# Patient Record
Sex: Female | Born: 1975 | Race: White | Hispanic: No | Marital: Married | Smoking: Never smoker
Health system: Southern US, Community
[De-identification: ages and names within clinical notes are randomized; demographics above are authoritative.]

## PROBLEM LIST (undated history)

## (undated) DIAGNOSIS — I10 Essential (primary) hypertension: Secondary | ICD-10-CM

## (undated) DIAGNOSIS — K219 Gastro-esophageal reflux disease without esophagitis: Secondary | ICD-10-CM

## (undated) DIAGNOSIS — T7840XA Allergy, unspecified, initial encounter: Secondary | ICD-10-CM

## (undated) HISTORY — DX: Allergy, unspecified, initial encounter: T78.40XA

## (undated) HISTORY — PX: JOINT REPLACEMENT: SHX530

## (undated) HISTORY — PX: TUBAL LIGATION: SHX77

## (undated) HISTORY — PX: EYE SURGERY: SHX253

## (undated) HISTORY — PX: ADENOIDECTOMY: SUR15

---

## 2011-06-23 ENCOUNTER — Emergency Department (HOSPITAL_BASED_OUTPATIENT_CLINIC_OR_DEPARTMENT_OTHER): Payer: PRIVATE HEALTH INSURANCE

## 2011-06-23 ENCOUNTER — Emergency Department (HOSPITAL_BASED_OUTPATIENT_CLINIC_OR_DEPARTMENT_OTHER)
Admission: EM | Admit: 2011-06-23 | Discharge: 2011-06-23 | Disposition: A | Discharge status: Home / Self Care | Payer: PRIVATE HEALTH INSURANCE | Attending: Emergency Medicine | Admitting: Emergency Medicine

## 2011-06-23 ENCOUNTER — Emergency Department (INDEPENDENT_AMBULATORY_CARE_PROVIDER_SITE_OTHER): Payer: PRIVATE HEALTH INSURANCE

## 2011-06-23 ENCOUNTER — Encounter: Payer: Self-pay | Admitting: *Deleted

## 2011-06-23 DIAGNOSIS — W010XXA Fall on same level from slipping, tripping and stumbling without subsequent striking against object, initial encounter: Secondary | ICD-10-CM | POA: Insufficient documentation

## 2011-06-23 DIAGNOSIS — S4980XA Other specified injuries of shoulder and upper arm, unspecified arm, initial encounter: Secondary | ICD-10-CM

## 2011-06-23 DIAGNOSIS — M25519 Pain in unspecified shoulder: Secondary | ICD-10-CM | POA: Insufficient documentation

## 2011-06-23 DIAGNOSIS — S46909A Unspecified injury of unspecified muscle, fascia and tendon at shoulder and upper arm level, unspecified arm, initial encounter: Secondary | ICD-10-CM | POA: Insufficient documentation

## 2011-06-23 DIAGNOSIS — Y92009 Unspecified place in unspecified non-institutional (private) residence as the place of occurrence of the external cause: Secondary | ICD-10-CM | POA: Insufficient documentation

## 2011-06-23 HISTORY — DX: Gastro-esophageal reflux disease without esophagitis: K21.9

## 2011-06-23 HISTORY — DX: Essential (primary) hypertension: I10

## 2011-06-23 MED ORDER — HYDROCODONE-ACETAMINOPHEN 5-325 MG PO TABS: 2.0000 | ORAL_TABLET | ORAL | Status: AC | PRN

## 2011-06-23 MED ORDER — IBUPROFEN 800 MG PO TABS: 800.0000 mg | ORAL_TABLET | Freq: Once | ORAL | Status: DC

## 2011-06-23 NOTE — ED Notes (Signed)
Pt given Tylenol 1gm and Motrin 800mg   Immediately following accident while en route to xray.  Meds taken from floor stock

## 2011-06-23 NOTE — ED Provider Notes (Signed)
History     CSN: [id number] Arrival date & time: 06/23/2011  9:41 PM  No chief complaint on file.  Patient is a 35 y.o. female presenting with shoulder injury. The history is provided by the patient.  Shoulder Injury This is a new problem. The current episode started today. The problem occurs constantly. The problem has been unchanged. Associated symptoms include joint swelling. The symptoms are aggravated by exertion. She has tried ice for the symptoms. The treatment provided mild relief.  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  No loss of conciousness,  No impact of head.    No past medical history on file.  No past surgical history on file.  No family history on file.  History  Substance Use Topics  . Smoking status: Not on file  . Smokeless tobacco: Not on file  . Alcohol Use: Not on file    OB History    No data available      Review of Systems  Musculoskeletal: Positive for joint swelling.  All other systems reviewed and are negative.    Physical Exam  There were no vitals taken for this visit.  Physical Exam  Nursing note and vitals reviewed. Constitutional: She appears well-developed and well-nourished.  HENT:  Head: Normocephalic and atraumatic.  Neck: Normal range of motion. Neck supple.  Musculoskeletal: She exhibits tenderness.  Neurological: She is alert.  Skin: Skin is warm and dry.  Psychiatric: She has a normal mood and affect.    ED Course  Procedures  MDM Xray no obv fx,  Small dark area center clavicle,  I discussed with Radiology, he advised does not appear to be a fracture.   Pt placed in a shoulder immbolizer and  Referred to [facility] for followup.      Langston Masker, [place] 06/24/11 [phone]

## 2011-06-23 NOTE — ED Notes (Signed)
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  Pt reports "burning sensation"  Pt taken to xray

## 2011-06-24 ENCOUNTER — Encounter: Payer: Self-pay | Admitting: Family Medicine

## 2011-06-24 ENCOUNTER — Ambulatory Visit (INDEPENDENT_AMBULATORY_CARE_PROVIDER_SITE_OTHER): Payer: Worker's Compensation | Admitting: Family Medicine

## 2011-06-24 VITALS — BP 146/97 | HR 92 | Temp 98.3°F | Ht 64.0 in | Wt 185.0 lb

## 2011-06-24 DIAGNOSIS — S4980XA Other specified injuries of shoulder and upper arm, unspecified arm, initial encounter: Secondary | ICD-10-CM

## 2011-06-24 DIAGNOSIS — S46909A Unspecified injury of unspecified muscle, fascia and tendon at shoulder and upper arm level, unspecified arm, initial encounter: Secondary | ICD-10-CM

## 2011-06-24 DIAGNOSIS — S4992XA Unspecified injury of left shoulder and upper arm, initial encounter: Secondary | ICD-10-CM

## 2011-06-24 NOTE — Progress Notes (Signed)
Subjective:    Patient ID: Tara [name], female    DOB: [birth year], 35 y.o.   MRN: [id number]  PCP: Dr. Cephus Richer  HPI 35 yo F here for left shoulder injury.  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 She backed up and tripped over a cord for a [employer] hugger and fell down directly onto left shoulder. Immediate pain especially anterior left shoulder. Has improved some since then but still has burning pain anterior shoulder. Has been icing, took aleve this am, and using sling for support. No prior left shoulder injuries. She is right handed. No neck pain or radiation into left arm.  Past Medical History  Diagnosis Date  . Hypertension   . GERD (gastroesophageal reflux disease)   . Allergy     Current Outpatient Prescriptions on File Prior to Visit  Medication Sig Dispense Refill  . drospirenone-ethinyl estradiol (YAZ,GIANVI,LORYNA) 3-0.02 MG tablet Take 1 tablet by mouth daily.        . fluticasone (FLONASE) 50 MCG/ACT nasal spray Place 2 sprays into the nose daily.        Marland Kitchen HYDROcodone-acetaminophen (NORCO) 5-325 MG per tablet Take 2 tablets by mouth every 4 (four) hours as needed for pain.  20 tablet  0  . loratadine (CLARITIN) 10 MG tablet Take 10 mg by mouth daily.        Marland Kitchen losartan (COZAAR) 50 MG tablet Take 50 mg by mouth daily.        . ranitidine (ZANTAC) 150 MG capsule Take 150 mg by mouth 2 (two) times daily.         Current Facility-Administered Medications on File Prior to Visit  Medication Dose Route Frequency Provider Last Rate Last Dose  . DISCONTD: ibuprofen (ADVIL,MOTRIN) tablet 800 mg  800 mg Oral Once Langston Masker, [place]        Past Surgical History  Procedure Date  . Tubal ligation   . Joint replacement   . Adenoidectomy     Allergies  Allergen Reactions  . Theophyllines     History   Social History  . Marital Status: Married    Spouse Name: N/A    Number of Children: N/A  . Years of  Education: N/A   Occupational History  . Not on file.   Social History Main Topics  . Smoking status: Never Smoker   . Smokeless tobacco: Not on file  . Alcohol Use: No  . Drug Use: No  . Sexually Active: Yes    Birth Control/ Protection: Pill, Surgical   Other Topics Concern  . Not on file   Social History Narrative  . No narrative on file    Family History  Problem Relation Age of Onset  . Sudden death Neg Hx   . Hypertension Neg Hx   . Hyperlipidemia Neg Hx   . Diabetes Neg Hx   . Heart attack Neg Hx     BP 146/97  Pulse 92  Temp(Src) 98.3 F (36.8 C) (Oral)  Ht 5\' 4"  (1.626 m)  Wt 185 lb (83.915 kg)  BMI 31.76 kg/m2  LMP 05/25/2011  Review of Systems See HPI above.    Objective:   Physical Exam Gen: NAD L shoulder: No swelling, ecchymoses.  No gross deformity. TTP lateral 1/2 of clavicle, superior pectoralis muscle and biceps tendon.  No TTP at [facility] joint however. FROM but pain anterior shoulder with all motions. Negative Hawkins, Neers. Negative Speeds, Yergasons. Negative Empty  can and resisted internal/external rotation with 5/5 strength. Negative apprehension. NV intact distally.  R shoulder: FROM without pain or weakness.    Assessment & Plan:  1. Left shoulder injury - X-rays reviewed and read as normal.  There is a very small area of middle superior clavicle that may represent part of a nondisplaced fracture.  However, would expect her to have more swelling and bruising following a fracture in this area.  More likely she has bony contusion and biceps/pec strains.  Treat conservatively - icing, aleve, sling for comfort.  Does not have to work until Wednesday - will monitor progress over next several days.  If still having severe pain with motions by Wednesday, advised her she should stay out of work and I would like to see her for follow-up 7-10 days from now for repeat evaluation and x-rays.  If she continues to improve, may return to work without  restrictions and follow-up as needed.

## 2011-06-24 NOTE — Assessment & Plan Note (Signed)
X-rays reviewed and read as normal.  There is a very small area of middle superior clavicle that may represent part of a nondisplaced fracture.  However, would expect her to have more swelling and bruising following a fracture in this area.  More likely she has bony contusion and biceps/pec strains.  Treat conservatively - icing, aleve, sling for comfort.  Does not have to work until Wednesday - will monitor progress over next several days.  If still having severe pain with motions by Wednesday, advised her she should stay out of work and I would like to see her for follow-up 7-10 days from now for repeat evaluation and x-rays.  If she continues to improve, may return to work without restrictions and follow-up as needed.

## 2011-06-24 NOTE — Patient Instructions (Signed)
Your exam is very reassuring. Your rotator cuff is intact, you do not have evidence of a shoulder dislocation or separation. You do have a distal clavicle contusion and strained your lateral pec muscle - the burning is likely from microtears that you get in the muscle though you could have injured some sensory nerves. This should heal well with relative rest, icing, aleve (2 tabs twice a day with food). Activity as tolerated. Use sling as needed.

## 2011-07-11 NOTE — ED Provider Notes (Signed)
Evaluation and management procedures were performed by the PA/NP under my supervision/collaboration.    Felisa Bonier, MD 07/11/11 [phone]

## 2014-08-07 ENCOUNTER — Encounter: Payer: Self-pay | Admitting: Emergency Medicine

## 2014-08-07 ENCOUNTER — Emergency Department
Admission: EM | Admit: 2014-08-07 | Discharge: 2014-08-07 | Disposition: A | Discharge status: Home / Self Care | Payer: 59 | Source: Home / Self Care | Attending: Emergency Medicine | Admitting: Emergency Medicine

## 2014-08-07 DIAGNOSIS — J209 Acute bronchitis, unspecified: Secondary | ICD-10-CM

## 2014-08-07 DIAGNOSIS — J042 Acute laryngotracheitis: Secondary | ICD-10-CM

## 2014-08-07 MED ORDER — AZITHROMYCIN 250 MG PO TABS: ORAL_TABLET | ORAL | Status: DC

## 2014-08-07 MED ORDER — PREDNISONE (PAK) 10 MG PO TABS: ORAL_TABLET | ORAL | Status: DC

## 2014-08-07 MED ORDER — PROMETHAZINE-CODEINE 6.25-10 MG/5ML PO SYRP: ORAL_SOLUTION | ORAL | Status: DC

## 2014-08-07 NOTE — ED Notes (Signed)
Tuesday started with sore throat, body aches, hoarseness, headache, productive cough, throat no longer sore.

## 2014-08-07 NOTE — ED Provider Notes (Signed)
CSN: [id number]     Arrival date & time 08/07/14  1253 History   First MD Initiated Contact with Patient 08/07/14 1300     Chief Complaint  Patient presents with  . URI   (Consider location/radiation/quality/duration/timing/severity/associated sxs/prior Treatment) HPI URI HISTORY  Louvinia is a 38 y.o. female who complains of onset of cold symptoms for 3 days ago, and now progressively worse. Started with a sore throat but that improved. Still having worsening hoarseness, sinus congestion, cough productive of yellowish sputum. Has chronic sinus allergies controlled on Flonase and oral antihistamines. She works as a Marine scientist at [employer]. No history of asthma. No chills/sweats +  Fever. No fever over 100.5 +  Nasal congestion +  Discolored Post-nasal drainage No sinus pain/pressure No sore throat now  +  cough No wheezing Positive chest congestion No hemoptysis No shortness of breath No pleuritic pain  No itchy/red eyes No earache  No nausea No vomiting No abdominal pain No diarrhea  No skin rashes +  Fatigue Mild myalgias No headache   Past Medical History  Diagnosis Date  . Hypertension   . GERD (gastroesophageal reflux disease)   . Allergy    Past Surgical History  Procedure Laterality Date  . Tubal ligation    . Joint replacement    . Adenoidectomy    . Eye surgery     Family History  Problem Relation Age of Onset  . Sudden death Neg Hx   . Hypertension Neg Hx   . Hyperlipidemia Neg Hx   . Diabetes Neg Hx   . Heart attack Neg Hx    History  Substance Use Topics  . Smoking status: Never Smoker   . Smokeless tobacco: Not on file  . Alcohol Use: No   OB History   Grav Para Term Preterm Abortions TAB SAB Ect Mult Living                 Review of Systems  All other systems reviewed and are negative.   Allergies  Theophyllines  Home Medications   Prior to Admission medications   Medication Sig Start Date End Date  Taking? Authorizing Provider  hydrochlorothiazide (HYDRODIURIL) 25 MG tablet Take 25 mg by mouth daily.   Yes Historical Provider, MD  levothyroxine (SYNTHROID, LEVOTHROID) 25 MCG tablet Take 25 mcg by mouth daily before breakfast.   Yes Historical Provider, MD  azithromycin (ZITHROMAX Z-PAK) 250 MG tablet Take 2 tablets on day one, then 1 tablet daily on days 2 through 5 08/07/14   Jacqulyn Cane, MD  drospirenone-ethinyl estradiol Sherrill Raring) 3-0.02 MG tablet Take 1 tablet by mouth daily.      Historical Provider, MD  fluticasone (FLONASE) 50 MCG/ACT nasal spray Place 2 sprays into the nose daily.      Historical Provider, MD  loratadine (CLARITIN) 10 MG tablet Take 10 mg by mouth daily.      Historical Provider, MD  losartan (COZAAR) 50 MG tablet Take 50 mg by mouth daily.      Historical Provider, MD  predniSONE (STERAPRED UNI-PAK) 10 MG tablet Take as directed for 6 days.--Take 6 on day 1, 5 on day 2, 4 on day 3, then 3 tablets on day 4, then 2 tablets on day 5, then 1 on day 6. 08/07/14   Jacqulyn Cane, MD  promethazine-codeine [facility] WITH CODEINE) 6.25-10 MG/5ML syrup Take 1-2 teaspoons every 4-6 hours as needed for cough. May cause drowsiness. 08/07/14   Jacqulyn Cane, MD  ranitidine (ZANTAC) 150 MG capsule Take 150 mg by mouth 2 (two) times daily.      Historical Provider, MD   BP 137/94  Pulse 115  Temp(Src) 98.2 F (36.8 C) (Oral)  Ht 5\' 4"  (1.626 m)  Wt 195 lb (88.451 kg)  BMI 33.46 kg/m2  SpO2 99% Physical Exam  Nursing note and vitals reviewed. Constitutional: She is oriented to person, place, and time. She appears well-developed and well-nourished. No distress.  HENT:  Head: Normocephalic and atraumatic.  Right Ear: Tympanic membrane normal.  Left Ear: Tympanic membrane normal.  Mouth/Throat: Oropharynx is clear and moist. No oropharyngeal exudate.   voice is hoarse. Pharynx minimal injection without tonsillar enlargement or exudate. Nose with boggy terminates  slight serous/mucoid drainage. TMs normal  Eyes: Conjunctivae are normal. Pupils are equal, round, and reactive to light. Right eye exhibits no discharge. Left eye exhibits no discharge. No scleral icterus.  Neck: Neck supple.  Cardiovascular: Normal rate, regular rhythm and normal heart sounds.   Pulmonary/Chest: No respiratory distress. She has no wheezes. She has rhonchi. She has no rales.  Abdominal: She exhibits no distension.  Musculoskeletal: She exhibits no edema and no tenderness.  Lymphadenopathy:    She has no cervical adenopathy.  Neurological: She is alert and oriented to person, place, and time. No cranial nerve deficit.  Skin: Skin is warm and dry. No rash noted.  Psychiatric: She has a normal mood and affect.    ED Course  Procedures (including critical care time) Labs Review Labs Reviewed - No data to display  Imaging Review No results found.   MDM   1. Acute laryngitis and tracheitis   2. Acute bronchitis, unspecified organism    In the differential is viral, bacterial, or atypical causes. Treatment options discussed, as well as risks, benefits, alternatives. Patient voiced understanding and agreement with the following plans:  Z-Pak prescribed-patient prefers to hold off on this for a day or 2 but fill rx if spikes a fever or not improving Other symptomatic care discussed. Prednisone 10 mg-6 day Dosepak for inflammatory component OTC symptomatic care and OTC cough med Small prescription for promethazine with codeine cough syrup for bedtime use only. Follow-up with your primary care doctor in 5-7 days if not improving, or sooner if symptoms become worse. Precautions discussed. Red flags discussed. Questions invited and answered. Patient voiced understanding and agreement.   Jacqulyn Cane, MD 08/07/14 1351

## 2014-11-17 ENCOUNTER — Encounter: Payer: Self-pay | Admitting: Family Medicine

## 2015-07-10 ENCOUNTER — Other Ambulatory Visit (HOSPITAL_BASED_OUTPATIENT_CLINIC_OR_DEPARTMENT_OTHER): Payer: Self-pay | Admitting: Obstetrics and Gynecology

## 2015-07-10 DIAGNOSIS — Z1231 Encounter for screening mammogram for malignant neoplasm of breast: Secondary | ICD-10-CM

## 2015-07-13 ENCOUNTER — Ambulatory Visit (HOSPITAL_BASED_OUTPATIENT_CLINIC_OR_DEPARTMENT_OTHER)
Admission: RE | Admit: 2015-07-13 | Discharge: 2015-07-13 | Disposition: A | Discharge status: Home / Self Care | Payer: 59 | Source: Ambulatory Visit | Attending: Obstetrics and Gynecology | Admitting: Obstetrics and Gynecology

## 2015-07-13 ENCOUNTER — Inpatient Hospital Stay (HOSPITAL_BASED_OUTPATIENT_CLINIC_OR_DEPARTMENT_OTHER): Admission: RE | Admit: 2015-07-13 | Payer: Self-pay | Source: Ambulatory Visit

## 2015-07-13 DIAGNOSIS — Z1231 Encounter for screening mammogram for malignant neoplasm of breast: Secondary | ICD-10-CM | POA: Insufficient documentation

## 2016-02-04 DIAGNOSIS — R6882 Decreased libido: Secondary | ICD-10-CM | POA: Diagnosis not present

## 2016-02-10 MED FILL — PANTOPRAZOLE SOD DR 40 MG T: 40 | 90 days supply | Qty: 90 | Fill #1

## 2016-02-10 MED FILL — FLUTICASONE PROP 50 MCG SPR: 50 | 90 days supply | Qty: 48 | Fill #2

## 2016-02-10 MED FILL — LOSARTAN POTASSIUM 100 MG T: 100 | 90 days supply | Qty: 90 | Fill #2

## 2016-02-10 MED FILL — HYDROXYZINE PAM 25 MG CAP: 25 | 90 days supply | Qty: 90 | Fill #1

## 2016-02-18 DIAGNOSIS — F39 Unspecified mood [affective] disorder: Secondary | ICD-10-CM | POA: Diagnosis not present

## 2016-04-04 MED FILL — AZITHROMYCIN 250 MG TABLET: 250 | 5 days supply | Qty: 6 | Fill #0

## 2016-05-16 MED FILL — FLUTICASONE PROP 50 MCG SPR: 50 | 90 days supply | Qty: 48 | Fill #3

## 2016-05-16 MED FILL — LOSARTAN POTASSIUM 100 MG T: 100 | 90 days supply | Qty: 90 | Fill #3

## 2016-05-16 MED FILL — PANTOPRAZOLE SOD DR 40 MG T: 40 | 90 days supply | Qty: 90 | Fill #2

## 2016-05-25 MED FILL — HYDROXYZINE PAM 25 MG CAP: 25 | 30 days supply | Qty: 30 | Fill #0

## 2016-06-28 ENCOUNTER — Other Ambulatory Visit (HOSPITAL_BASED_OUTPATIENT_CLINIC_OR_DEPARTMENT_OTHER): Payer: Self-pay | Admitting: Obstetrics and Gynecology

## 2016-06-28 DIAGNOSIS — Z139 Encounter for screening, unspecified: Secondary | ICD-10-CM

## 2016-07-13 DIAGNOSIS — Z01419 Encounter for gynecological examination (general) (routine) without abnormal findings: Secondary | ICD-10-CM | POA: Diagnosis not present

## 2016-07-13 DIAGNOSIS — R6882 Decreased libido: Secondary | ICD-10-CM | POA: Diagnosis not present

## 2016-07-25 ENCOUNTER — Ambulatory Visit (HOSPITAL_BASED_OUTPATIENT_CLINIC_OR_DEPARTMENT_OTHER)
Admission: RE | Admit: 2016-07-25 | Discharge: 2016-07-25 | Disposition: A | Discharge status: Home / Self Care | Payer: 59 | Source: Ambulatory Visit | Attending: Obstetrics and Gynecology | Admitting: Obstetrics and Gynecology

## 2016-07-25 ENCOUNTER — Other Ambulatory Visit (HOSPITAL_BASED_OUTPATIENT_CLINIC_OR_DEPARTMENT_OTHER): Payer: Self-pay | Admitting: Obstetrics and Gynecology

## 2016-07-25 DIAGNOSIS — Z1239 Encounter for other screening for malignant neoplasm of breast: Secondary | ICD-10-CM

## 2016-07-25 DIAGNOSIS — Z1231 Encounter for screening mammogram for malignant neoplasm of breast: Secondary | ICD-10-CM | POA: Insufficient documentation

## 2016-08-01 DIAGNOSIS — Z0000 Encounter for general adult medical examination without abnormal findings: Secondary | ICD-10-CM | POA: Diagnosis not present

## 2016-08-01 DIAGNOSIS — J301 Allergic rhinitis due to pollen: Secondary | ICD-10-CM | POA: Diagnosis not present

## 2016-08-01 DIAGNOSIS — K219 Gastro-esophageal reflux disease without esophagitis: Secondary | ICD-10-CM | POA: Diagnosis not present

## 2016-08-01 DIAGNOSIS — E784 Other hyperlipidemia: Secondary | ICD-10-CM | POA: Diagnosis not present

## 2016-08-01 DIAGNOSIS — Z800 Family history of malignant neoplasm of digestive organs: Secondary | ICD-10-CM | POA: Diagnosis not present

## 2016-08-01 DIAGNOSIS — I10 Essential (primary) hypertension: Secondary | ICD-10-CM | POA: Diagnosis not present

## 2016-08-01 MED FILL — FLUTICASONE PROP 50 MCG SPR: 50 | 90 days supply | Qty: 48 | Fill #0

## 2016-08-01 MED FILL — HYDROCHLOROTHIAZIDE 25 MG T: 25 | 90 days supply | Qty: 90 | Fill #0

## 2016-08-01 MED FILL — HYDROXYZINE PAM 25 MG CAP: 25 | 90 days supply | Qty: 90 | Fill #0

## 2016-08-01 MED FILL — PANTOPRAZOLE SOD DR 40 MG T: 40 | 90 days supply | Qty: 90 | Fill #0

## 2016-08-01 MED FILL — LOSARTAN POTASSIUM 100 MG T: 100 | 90 days supply | Qty: 90 | Fill #0

## 2016-08-18 DIAGNOSIS — E669 Obesity, unspecified: Secondary | ICD-10-CM | POA: Diagnosis not present

## 2016-08-18 DIAGNOSIS — K219 Gastro-esophageal reflux disease without esophagitis: Secondary | ICD-10-CM | POA: Diagnosis not present

## 2016-08-18 DIAGNOSIS — Z800 Family history of malignant neoplasm of digestive organs: Secondary | ICD-10-CM | POA: Diagnosis not present

## 2016-08-18 DIAGNOSIS — Z1211 Encounter for screening for malignant neoplasm of colon: Secondary | ICD-10-CM | POA: Diagnosis not present

## 2016-08-18 MED FILL — GAVILYTE-G SOLUTION: 236 | 1 days supply | Qty: 4000 | Fill #0

## 2016-09-01 DIAGNOSIS — Z0100 Encounter for examination of eyes and vision without abnormal findings: Secondary | ICD-10-CM | POA: Diagnosis not present

## 2016-09-01 DIAGNOSIS — H524 Presbyopia: Secondary | ICD-10-CM | POA: Diagnosis not present

## 2016-09-22 MED FILL — CIPROFLOXACIN HCL 500 MG TA: 500 | 7 days supply | Qty: 14 | Fill #0

## 2016-09-23 DIAGNOSIS — Z800 Family history of malignant neoplasm of digestive organs: Secondary | ICD-10-CM | POA: Diagnosis not present

## 2016-09-23 DIAGNOSIS — D125 Benign neoplasm of sigmoid colon: Secondary | ICD-10-CM | POA: Diagnosis not present

## 2016-09-23 DIAGNOSIS — K635 Polyp of colon: Secondary | ICD-10-CM | POA: Diagnosis not present

## 2016-09-23 DIAGNOSIS — Z1211 Encounter for screening for malignant neoplasm of colon: Secondary | ICD-10-CM | POA: Diagnosis not present

## 2016-09-30 ENCOUNTER — Emergency Department
Admission: EM | Admit: 2016-09-30 | Discharge: 2016-09-30 | Disposition: A | Discharge status: Home / Self Care | Payer: 59 | Source: Home / Self Care | Attending: Family Medicine | Admitting: Family Medicine

## 2016-09-30 ENCOUNTER — Encounter: Payer: Self-pay | Admitting: *Deleted

## 2016-09-30 DIAGNOSIS — B9789 Other viral agents as the cause of diseases classified elsewhere: Secondary | ICD-10-CM | POA: Diagnosis not present

## 2016-09-30 DIAGNOSIS — J069 Acute upper respiratory infection, unspecified: Secondary | ICD-10-CM

## 2016-09-30 MED ORDER — TRIAMCINOLONE ACETONIDE 40 MG/ML IJ SUSP
40.0000 mg | Freq: Once | INTRAMUSCULAR | Status: AC
  Administered 2016-09-30: 40 mg via INTRAMUSCULAR

## 2016-09-30 MED ORDER — BENZONATATE 200 MG PO CAPS: 200.0000 mg | ORAL_CAPSULE | Freq: Every day | ORAL | 0 refills | Status: DC

## 2016-09-30 MED ORDER — AZITHROMYCIN 250 MG PO TABS: ORAL_TABLET | ORAL | 0 refills | Status: DC

## 2016-09-30 MED FILL — AZITHROMYCIN 250 MG TABLET: 250 | 5 days supply | Qty: 6 | Fill #0

## 2016-09-30 NOTE — ED Triage Notes (Signed)
Patient c/o 5 days of productive cough and congestin. Sore throat initially. More recently SOB at times. Taken Mucinex, Delsym and tylenol.

## 2016-09-30 NOTE — ED Provider Notes (Signed)
Tara [name] C[facility]    CSN: [id number] Arrival date & time: 09/30/16  [id number]     History   Chief Complaint Chief Complaint  Patient presents with  . URI    HPI Tara [name] is a 40 y.o. female.   Patient complains of five day history of typical cold-like symptoms developing over several days, including mild sore throat, sinus congestion, headache, fatigue, and cough.  Begin two days ago she started feeling tight in her anterior chest with inspiration, but not pleuritic pain.  She has been taking Mucinex and Delsym. She has a history of perennial rhinitis and takes Claritin and Flonase.   The history is provided by the patient.    Past Medical History:  Diagnosis Date  . Allergy   . GERD (gastroesophageal reflux disease)   . Hypertension     Patient Active Problem List   Diagnosis Date Noted  . Injury of left shoulder 06/24/2011    Past Surgical History:  Procedure Laterality Date  . ADENOIDECTOMY    . EYE SURGERY    . JOINT REPLACEMENT    . TUBAL LIGATION      OB History    No data available       Home Medications    Prior to Admission medications   Medication Sig Start Date End Date Taking? Authorizing Provider  fluticasone (FLONASE) 50 MCG/ACT nasal spray Place 2 sprays into the nose daily.     Yes Historical Provider, MD  hydrochlorothiazide (HYDRODIURIL) 25 MG tablet Take 25 mg by mouth daily.   Yes Historical Provider, MD  loratadine (CLARITIN) 10 MG tablet Take 10 mg by mouth daily.   Yes Historical Provider, MD  losartan (COZAAR) 100 MG tablet Take 100 mg by mouth daily.   Yes Historical Provider, MD  pantoprazole (PROTONIX) 40 MG tablet Take 40 mg by mouth daily.   Yes Historical Provider, MD  Testosterone 20 % CREA by Does not apply route.   Yes Historical Provider, MD  azithromycin (ZITHROMAX Z-PAK) 250 MG tablet Take 2 tabs today; then begin one tab once daily for 4 more days. (Rx void after 10/08/16) 09/30/16   Kandra Nicolas, MD    benzonatate (TESSALON) 200 MG capsule Take 1 capsule (200 mg total) by mouth at bedtime. Take as needed for cough 09/30/16   Kandra Nicolas, MD    Family History Family History  Problem Relation Age of Onset  . Cancer Mother     Colon CA  . Sudden death Neg Hx   . Hypertension Neg Hx   . Hyperlipidemia Neg Hx   . Diabetes Neg Hx   . Heart attack Neg Hx     Social History Social History  Substance Use Topics  . Smoking status: Never Smoker  . Smokeless tobacco: Never Used  . Alcohol use No     Allergies   Theophyllines   Review of Systems Review of Systems + sore throat + cough No pleuritic pain No wheezing + nasal congestion + post-nasal drainage No sinus pain/pressure No itchy/red eyes No earache No hemoptysis + SOB No fever, + chills No nausea No vomiting No abdominal pain No diarrhea No urinary symptoms No skin rash + fatigue + myalgias No headache Used OTC meds without relief   Physical Exam Triage Vital Signs ED Triage Vitals  Enc Vitals Group     BP      Pulse      Resp      Temp  Temp src      SpO2      Weight      Height      Head Circumference      Peak Flow      Pain Score      Pain Loc      Pain Edu?      Excl. in [place]?    No data found.   Updated Vital Signs BP 131/86 (BP Location: Left Arm)   Pulse 85   Temp 98.3 F (36.8 C) (Oral)   Resp 16   Wt 191 lb (86.6 kg)   SpO2 99%   BMI 32.79 kg/m   Visual Acuity Right Eye Distance:   Left Eye Distance:   Bilateral Distance:    Right Eye Near:   Left Eye Near:    Bilateral Near:     Physical Exam Nursing notes and Vital Signs reviewed. Appearance:  Patient appears stated age, and in no acute distress Eyes:  Pupils are equal, round, and reactive to light and accomodation.  Extraocular movement is intact.  Conjunctivae are not inflamed  Ears:  Canals normal.  Tympanic membranes normal.  Nose:  Mildly congested turbinates.  No sinus tenderness.  Pharynx:   Normal Neck:  Supple.  Tender enlarged posterior/lateral nodes are palpated bilaterally  Lungs:  Clear to auscultation.  Breath sounds are equal.  Moving air well. Heart:  Regular rate and rhythm without murmurs, rubs, or gallops.  Abdomen:  Nontender without masses or hepatosplenomegaly.  Bowel sounds are present.  No CVA or flank tenderness.  Extremities:  No edema.  Skin:  No rash present.    UC Treatments / Results  Labs (all labs ordered are listed, but only abnormal results are displayed) Labs Reviewed - No data to display  EKG  EKG Interpretation None       Radiology No results found.  Procedures Procedures (including critical care time)  Medications Ordered in UC Medications  triamcinolone acetonide (KENALOG-40) injection 40 mg (40 mg Intramuscular Given 09/30/16 0954)     Initial Impression / Assessment and Plan / UC Course  I have reviewed the triage vital signs and the nursing notes.  Pertinent labs & imaging results that were available during my care of the patient were reviewed by me and considered in my medical decision making (see chart for details).  Clinical Course   Administered Kenalog 40mg  IM  There is no evidence of bacterial infection today.   Prescription written for Benzonatate [facility]) to take at bedtime for night-time cough.  Continue plain guaifenesin (1200mg  extended release tabs such as Mucinex) twice daily, with plenty of water, for cough and congestion. Get adequate rest.   May use Afrin nasal spray (or generic oxymetazoline) twice daily for about 5 days and then discontinue.  Also recommend using saline nasal spray several times daily and saline nasal irrigation (AYR is a common brand).  Use Flonase nasal spray each morning after using Afrin nasal spray and saline nasal irrigation. Try warm salt water gargles for sore throat.  Stop all antihistamines for now, and other non-prescription cough/cold preparations. Begin Azithromycin if not  improving about one week or if persistent fever develops (Given a prescription to hold, with an expiration date)  Follow-up with family doctor if not improving about10 days.      Final Clinical Impressions(s) / UC Diagnoses   Final diagnoses:  Viral URI with cough    New Prescriptions New Prescriptions   AZITHROMYCIN (ZITHROMAX Z-PAK) 250 MG  TABLET    Take 2 tabs today; then begin one tab once daily for 4 more days. (Rx void after 10/08/16)   BENZONATATE (TESSALON) 200 MG CAPSULE    Take 1 capsule (200 mg total) by mouth at bedtime. Take as needed for cough     Kandra Nicolas, MD 09/30/16 1001

## 2016-09-30 NOTE — Discharge Instructions (Signed)
Continue plain guaifenesin (1200mg  extended release tabs such as Mucinex) twice daily, with plenty of water, for cough and congestion. Get adequate rest.   May use Afrin nasal spray (or generic oxymetazoline) twice daily for about 5 days and then discontinue.  Also recommend using saline nasal spray several times daily and saline nasal irrigation (AYR is a common brand).  Use Flonase nasal spray each morning after using Afrin nasal spray and saline nasal irrigation. Try warm salt water gargles for sore throat.  Stop all antihistamines for now, and other non-prescription cough/cold preparations. Begin Azithromycin if not improving about one week or if persistent fever develops   Follow-up with family doctor if not improving about10 days.

## 2016-11-29 MED FILL — FLUTICASONE PROP 50 MCG SPR: 50 | 90 days supply | Qty: 48 | Fill #1

## 2016-11-29 MED FILL — LOSARTAN POTASSIUM 100 MG T: 100 | 90 days supply | Qty: 90 | Fill #1

## 2016-11-29 MED FILL — PANTOPRAZOLE SOD DR 40 MG T: 40 | 90 days supply | Qty: 90 | Fill #1

## 2017-03-17 MED FILL — HYDROXYZINE PAM 25 MG CAP: 25 | 90 days supply | Qty: 90 | Fill #1

## 2017-03-22 MED FILL — FLUTICASONE PROP 50 MCG SPR: 50 | 90 days supply | Qty: 48 | Fill #2

## 2017-03-22 MED FILL — LOSARTAN POTASSIUM 100 MG T: 100 | 90 days supply | Qty: 90 | Fill #2

## 2017-03-22 MED FILL — PANTOPRAZOLE SOD DR 40 MG T: 40 | 90 days supply | Qty: 90 | Fill #2

## 2017-03-30 MED FILL — AMOXICILLIN 500 MG CAPSULE: 500 | 7 days supply | Qty: 21 | Fill #0

## 2017-05-02 ENCOUNTER — Other Ambulatory Visit (HOSPITAL_BASED_OUTPATIENT_CLINIC_OR_DEPARTMENT_OTHER): Payer: Self-pay | Admitting: Obstetrics and Gynecology

## 2017-05-02 DIAGNOSIS — Z1239 Encounter for other screening for malignant neoplasm of breast: Secondary | ICD-10-CM

## 2017-07-03 MED FILL — PANTOPRAZOLE SOD DR 40 MG T: 40 | 90 days supply | Qty: 90 | Fill #3

## 2017-07-03 MED FILL — HYDROXYZINE PAM 25 MG CAP: 25 | 90 days supply | Qty: 90 | Fill #2

## 2017-07-03 MED FILL — FLUTICASONE PROP 50 MCG SPR: 50 | 90 days supply | Qty: 48 | Fill #3

## 2017-07-03 MED FILL — LOSARTAN POTASSIUM 100 MG T: 100 | 90 days supply | Qty: 90 | Fill #3

## 2017-07-17 DIAGNOSIS — Z01419 Encounter for gynecological examination (general) (routine) without abnormal findings: Secondary | ICD-10-CM | POA: Diagnosis not present

## 2017-07-17 DIAGNOSIS — Z1151 Encounter for screening for human papillomavirus (HPV): Secondary | ICD-10-CM | POA: Diagnosis not present

## 2017-07-27 ENCOUNTER — Ambulatory Visit (HOSPITAL_BASED_OUTPATIENT_CLINIC_OR_DEPARTMENT_OTHER): Payer: Self-pay

## 2017-08-01 ENCOUNTER — Ambulatory Visit (INDEPENDENT_AMBULATORY_CARE_PROVIDER_SITE_OTHER): Payer: 59 | Admitting: Sports Medicine

## 2017-08-01 ENCOUNTER — Encounter (INDEPENDENT_AMBULATORY_CARE_PROVIDER_SITE_OTHER): Payer: Self-pay

## 2017-08-01 ENCOUNTER — Encounter: Payer: Self-pay | Admitting: Sports Medicine

## 2017-08-01 DIAGNOSIS — M1711 Unilateral primary osteoarthritis, right knee: Secondary | ICD-10-CM

## 2017-08-01 DIAGNOSIS — K219 Gastro-esophageal reflux disease without esophagitis: Secondary | ICD-10-CM

## 2017-08-01 DIAGNOSIS — Z0000 Encounter for general adult medical examination without abnormal findings: Secondary | ICD-10-CM | POA: Diagnosis not present

## 2017-08-01 DIAGNOSIS — R635 Abnormal weight gain: Secondary | ICD-10-CM | POA: Diagnosis not present

## 2017-08-01 DIAGNOSIS — I10 Essential (primary) hypertension: Secondary | ICD-10-CM | POA: Diagnosis not present

## 2017-08-01 MED ORDER — PHENTERMINE HCL 37.5 MG PO TABS: ORAL_TABLET | ORAL | 0 refills | Status: DC

## 2017-08-01 MED FILL — PHENTERMINE 37.5 MG TABLET: 37.5 | 30 days supply | Qty: 30 | Fill #0

## 2017-08-01 NOTE — Assessment & Plan Note (Signed)
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

## 2017-08-01 NOTE — Assessment & Plan Note (Addendum)
Up-to-date on screening measures. We can obtain HIV screening next year. Due for tetanus next year.

## 2017-08-01 NOTE — Assessment & Plan Note (Signed)
Continue Protonix °

## 2017-08-01 NOTE — Progress Notes (Signed)
  Subjective:    CC: Establish care/CPE.   HPI:  Hypertension: Controlled  Abnormal weight gain: Would like of losing additional weight  Right knee pain: Tells me she does have some osteoarthritis, has had injections but never viscous supplementation.  Preventive measures: Would like a physical today. Up-to-date on most of her screening measures.  Past medical history:  Negative.  See flowsheet/record as well for more information.  Surgical history: Negative.  See flowsheet/record as well for more information.  Family history: Negative.  See flowsheet/record as well for more information.  Social history: Negative.  See flowsheet/record as well for more information.  Allergies, and medications have been entered into the medical record, reviewed, and no changes needed.    Review of Systems: No headache, visual changes, nausea, vomiting, diarrhea, constipation, dizziness, abdominal pain, skin rash, fevers, chills, night sweats, swollen lymph nodes, weight loss, chest pain, body aches, joint swelling, muscle aches, shortness of breath, mood changes, visual or auditory hallucinations.  Objective:    General: Well Developed, well nourished, and in no acute distress.  Neuro: Alert and oriented x3, extra-ocular muscles intact, sensation grossly intact. Cranial nerves II through XII are intact, motor, sensory, and coordinative functions are all intact. HEENT: Normocephalic, atraumatic, pupils equal round reactive to light, neck supple, no masses, no lymphadenopathy, thyroid nonpalpable. Oropharynx, nasopharynx, external ear canals are unremarkable. Skin: Warm and dry, no rashes noted.  Cardiac: Regular rate and rhythm, no murmurs rubs or gallops.  Respiratory: Clear to auscultation bilaterally. Not using accessory muscles, speaking in full sentences.  Abdominal: Soft, nontender, nondistended, positive bowel sounds, no masses, no organomegaly.  Musculoskeletal: Shoulder, elbow, wrist, hip, knee,  ankle stable, and with full range of motion.  Impression and Recommendations:    The patient was counselled, risk factors were discussed, anticipatory guidance given.  Primary osteoarthritis of right knee Continue Aleve, helping her with weight loss. Rehabilitation exercises given for patellofemoral syndrome, if persistent symptoms we can do viscosupplementation. I would like her to return for custom orthotics at some point.  Abnormal weight gain Starting phentermine, return monthly for weight checks and refills, she is going to check with her plan details to see if these visits will be covered.  Annual physical exam Up-to-date on screening measures. We can obtain HIV screening next year. Due for tetanus next year.  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  ___________________________________________ [name]. Dianah Field, M.D., ABFM., CAQSM. Primary Care and [place] Instructor of [place] of [facility] of Medicine

## 2017-08-01 NOTE — Assessment & Plan Note (Signed)
Starting phentermine, return monthly for weight checks and refills, she is going to check with her plan details to see if these visits will be covered.

## 2017-08-01 NOTE — Assessment & Plan Note (Addendum)
Continue Aleve, helping her with weight loss. Rehabilitation exercises given for patellofemoral syndrome, if persistent symptoms we can do viscosupplementation. I would like her to return for custom orthotics at some point.

## 2017-08-03 ENCOUNTER — Ambulatory Visit (HOSPITAL_BASED_OUTPATIENT_CLINIC_OR_DEPARTMENT_OTHER)
Admission: RE | Admit: 2017-08-03 | Discharge: 2017-08-03 | Disposition: A | Discharge status: Home / Self Care | Payer: 59 | Source: Ambulatory Visit | Attending: Obstetrics and Gynecology | Admitting: Obstetrics and Gynecology

## 2017-08-03 DIAGNOSIS — Z1231 Encounter for screening mammogram for malignant neoplasm of breast: Secondary | ICD-10-CM | POA: Diagnosis not present

## 2017-08-03 DIAGNOSIS — Z1239 Encounter for other screening for malignant neoplasm of breast: Secondary | ICD-10-CM

## 2017-08-29 ENCOUNTER — Ambulatory Visit (INDEPENDENT_AMBULATORY_CARE_PROVIDER_SITE_OTHER): Payer: 59 | Admitting: Sports Medicine

## 2017-08-29 DIAGNOSIS — Z30433 Encounter for removal and reinsertion of intrauterine contraceptive device: Secondary | ICD-10-CM | POA: Diagnosis not present

## 2017-08-29 DIAGNOSIS — R635 Abnormal weight gain: Secondary | ICD-10-CM

## 2017-08-29 MED ORDER — TOPIRAMATE 50 MG PO TABS
ORAL_TABLET | ORAL | 0 refills | Status: DC
Start: 2017-08-29 — End: 2017-08-29

## 2017-08-29 MED ORDER — PHENTERMINE HCL 37.5 MG PO TABS: ORAL_TABLET | ORAL | 0 refills | Status: DC

## 2017-08-29 MED FILL — PHENTERMINE 37.5 MG TABLET: 37.5 | 30 days supply | Qty: 30 | Fill #0

## 2017-08-29 NOTE — Progress Notes (Signed)
  Subjective:    CC: Follow-up  HPI: Obesity: Only 3 pound weight loss over the last month on phentermine, she really has been taking the medication every day.  Did get some insomnia.  Past medical history:  Negative.  See flowsheet/record as well for more information.  Surgical history: Negative.  See flowsheet/record as well for more information.  Family history: Negative.  See flowsheet/record as well for more information.  Social history: Negative.  See flowsheet/record as well for more information.  Allergies, and medications have been entered into the medical record, reviewed, and no changes needed.   Review of Systems: No fevers, chills, night sweats, weight loss, chest pain, or shortness of breath.   Objective:    General: Well Developed, well nourished, and in no acute distress.  Neuro: Alert and oriented x3, extra-ocular muscles intact, sensation grossly intact.  HEENT: Normocephalic, atraumatic, pupils equal round reactive to light, neck supple, no masses, no lymphadenopathy, thyroid nonpalpable.  Skin: Warm and dry, no rashes. Cardiac: Regular rate and rhythm, no murmurs rubs or gallops, no lower extremity edema.  Respiratory: Clear to auscultation bilaterally. Not using accessory muscles, speaking in full sentences.  Impression and Recommendations:    Abnormal weight gain Has only lost 3 pounds, declines the addition of Topamax. Agrees to lose 10 pounds over the next month or we can add Topamax, she has been only doing one phentermine intermittently. It did cause some insomnia so I did advise her to try half pill instead of a full pill. Return in 1 month, entering the second month.  I spent 25 minutes with this patient, greater than 50% was face-to-face time counseling regarding the above diagnoses ___________________________________________ [name]. Dianah Field, M.D., ABFM., CAQSM. Primary Care and [place]  Instructor of [place] of [facility] of Medicine

## 2017-08-29 NOTE — Assessment & Plan Note (Signed)
Has only lost 3 pounds, declines the addition of Topamax. Agrees to lose 10 pounds over the next month or we can add Topamax, she has been only doing one phentermine intermittently. It did cause some insomnia so I did advise her to try half pill instead of a full pill. Return in 1 month, entering the second month.

## 2017-09-07 DIAGNOSIS — H524 Presbyopia: Secondary | ICD-10-CM | POA: Diagnosis not present

## 2017-09-27 ENCOUNTER — Ambulatory Visit: Payer: Self-pay | Admitting: Sports Medicine

## 2017-09-27 DIAGNOSIS — Z30431 Encounter for routine checking of intrauterine contraceptive device: Secondary | ICD-10-CM | POA: Diagnosis not present

## 2017-10-13 ENCOUNTER — Other Ambulatory Visit: Payer: Self-pay

## 2017-10-13 MED ORDER — LOSARTAN POTASSIUM 100 MG PO TABS: 100.0000 mg | ORAL_TABLET | Freq: Every day | ORAL | 1 refills | Status: DC

## 2017-10-13 MED ORDER — HYDROXYZINE PAMOATE 25 MG PO CAPS: 25.0000 mg | ORAL_CAPSULE | Freq: Every day | ORAL | 1 refills | Status: DC

## 2017-10-13 MED ORDER — FLUTICASONE PROPIONATE 50 MCG/ACT NA SUSP: 2.0000 | Freq: Every day | NASAL | 1 refills | Status: DC

## 2017-10-13 MED ORDER — PANTOPRAZOLE SODIUM 40 MG PO TBEC: 40.0000 mg | DELAYED_RELEASE_TABLET | Freq: Every day | ORAL | 1 refills | Status: DC

## 2017-10-13 MED FILL — PANTOPRAZOLE SOD DR 40 MG T: 40 | 90 days supply | Qty: 90 | Fill #0

## 2017-10-13 MED FILL — HYDROXYZINE PAM 25 MG CAP: 25 | 90 days supply | Qty: 90 | Fill #0

## 2017-10-13 MED FILL — LOSARTAN POTASSIUM 100 MG T: 100 | 90 days supply | Qty: 90 | Fill #0

## 2017-12-20 MED FILL — PANTOPRAZOLE SOD DR 40 MG T: 40 | 90 days supply | Qty: 90 | Fill #1

## 2018-01-31 MED FILL — HYDROXYZINE PAM 25 MG CAP: 25 | 90 days supply | Qty: 90 | Fill #1

## 2018-04-10 MED FILL — predniSONE 10 MG TABS: 10 | 6 days supply | Qty: 30 | Fill #0

## 2018-04-10 MED FILL — METHOCARBAMOL 500 MG TABLET: 500 | 5 days supply | Qty: 30 | Fill #0

## 2018-04-12 ENCOUNTER — Encounter: Payer: Self-pay | Admitting: Sports Medicine

## 2018-04-13 MED ORDER — HYDROXYZINE PAMOATE 25 MG PO CAPS: 25.0000 mg | ORAL_CAPSULE | Freq: Every day | ORAL | 3 refills | Status: DC

## 2018-04-13 MED FILL — HYDROXYZINE PAM 25 MG CAP: 25 | 90 days supply | Qty: 180 | Fill #0

## 2018-04-23 ENCOUNTER — Other Ambulatory Visit: Payer: Self-pay | Admitting: Sports Medicine

## 2018-04-23 MED FILL — PANTOPRAZOLE SOD DR 40 MG T: 40 | 90 days supply | Qty: 90 | Fill #0

## 2018-06-20 ENCOUNTER — Other Ambulatory Visit: Payer: Self-pay | Admitting: Obstetrics and Gynecology

## 2018-06-20 ENCOUNTER — Other Ambulatory Visit (HOSPITAL_BASED_OUTPATIENT_CLINIC_OR_DEPARTMENT_OTHER): Payer: Self-pay | Admitting: Obstetrics and Gynecology

## 2018-06-20 DIAGNOSIS — Z1231 Encounter for screening mammogram for malignant neoplasm of breast: Secondary | ICD-10-CM

## 2018-07-18 DIAGNOSIS — Z803 Family history of malignant neoplasm of breast: Secondary | ICD-10-CM | POA: Diagnosis not present

## 2018-07-18 DIAGNOSIS — R319 Hematuria, unspecified: Secondary | ICD-10-CM | POA: Diagnosis not present

## 2018-07-18 DIAGNOSIS — Z01419 Encounter for gynecological examination (general) (routine) without abnormal findings: Secondary | ICD-10-CM | POA: Diagnosis not present

## 2018-07-18 DIAGNOSIS — Z800 Family history of malignant neoplasm of digestive organs: Secondary | ICD-10-CM | POA: Diagnosis not present

## 2018-07-18 DIAGNOSIS — N912 Amenorrhea, unspecified: Secondary | ICD-10-CM | POA: Diagnosis not present

## 2018-08-02 ENCOUNTER — Encounter: Payer: Self-pay | Admitting: Sports Medicine

## 2018-08-02 ENCOUNTER — Ambulatory Visit (INDEPENDENT_AMBULATORY_CARE_PROVIDER_SITE_OTHER): Payer: 59 | Admitting: Sports Medicine

## 2018-08-02 VITALS — BP 146/75 | HR 103 | Ht 63.0 in | Wt 179.0 lb

## 2018-08-02 DIAGNOSIS — I10 Essential (primary) hypertension: Secondary | ICD-10-CM | POA: Diagnosis not present

## 2018-08-02 DIAGNOSIS — Z0000 Encounter for general adult medical examination without abnormal findings: Secondary | ICD-10-CM | POA: Diagnosis not present

## 2018-08-02 DIAGNOSIS — M722 Plantar fascial fibromatosis: Secondary | ICD-10-CM | POA: Insufficient documentation

## 2018-08-02 DIAGNOSIS — F419 Anxiety disorder, unspecified: Secondary | ICD-10-CM | POA: Diagnosis not present

## 2018-08-02 DIAGNOSIS — K219 Gastro-esophageal reflux disease without esophagitis: Secondary | ICD-10-CM | POA: Diagnosis not present

## 2018-08-02 DIAGNOSIS — Z23 Encounter for immunization: Secondary | ICD-10-CM | POA: Diagnosis not present

## 2018-08-02 MED ORDER — PANTOPRAZOLE SODIUM 40 MG PO TBEC: 40.0000 mg | DELAYED_RELEASE_TABLET | Freq: Two times a day (BID) | ORAL | 3 refills | Status: DC

## 2018-08-02 MED ORDER — HYDROCHLOROTHIAZIDE 25 MG PO TABS: 25.0000 mg | ORAL_TABLET | ORAL | 3 refills | Status: DC

## 2018-08-02 MED ORDER — HYDROXYZINE HCL 25 MG PO TABS: 25.0000 mg | ORAL_TABLET | Freq: Every day | ORAL | 3 refills | Status: DC

## 2018-08-02 MED ORDER — LOSARTAN POTASSIUM 100 MG PO TABS: 100.0000 mg | ORAL_TABLET | Freq: Every day | ORAL | 3 refills | Status: DC

## 2018-08-02 MED FILL — LOSARTAN POTASSIUM 100 MG T: 100 | 90 days supply | Qty: 90 | Fill #0

## 2018-08-02 MED FILL — HYDROCHLOROTHIAZIDE 25 MG T: 25 | 84 days supply | Qty: 36 | Fill #0

## 2018-08-02 NOTE — Assessment & Plan Note (Signed)
Continue rehabilitation exercises, return for custom molded orthotics. She will probably return at the beginning of the new year.

## 2018-08-02 NOTE — Assessment & Plan Note (Signed)
Annual physical as above. Up-to-date on cervical cancer, colon cancer screening. Routine labs were done at an outside facility, everything is good with the exception of lipids. She will try a low-cholesterol diet, we can recheck in 3 months.

## 2018-08-02 NOTE — Assessment & Plan Note (Signed)
Refilling hydroxyzine, well-controlled with 25 to 50 mg daily.

## 2018-08-02 NOTE — Assessment & Plan Note (Signed)
Increasing pantoprazole to twice a day. If insufficient relief we can refer for endoscopic gastric fundoplication.

## 2018-08-02 NOTE — Assessment & Plan Note (Signed)
Doing well with losartan and occasional HCTZ.  Refilling medication. BUN, creatinine, electrolyte are normal.

## 2018-08-02 NOTE — Progress Notes (Signed)
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  I reviewed the past medical history, family history, social history, surgical history, and allergies today and no changes were needed.  Please see the problem list section below in epic for further details.  Past Medical History: Past Medical History:  Diagnosis Date  . Allergy   . GERD (gastroesophageal reflux disease)   . Hypertension    Past Surgical History: Past Surgical History:  Procedure Laterality Date  . ADENOIDECTOMY    . EYE SURGERY    . JOINT REPLACEMENT    . TUBAL LIGATION     Social History: Social History   Socioeconomic History  . Marital status: Married    Spouse name: Not on file  . Number of children: Not on file  . Years of education: Not on file  . Highest education level: Not on file  Occupational History  . Not on file  Social Needs  . Financial resource strain: Not on file  . Food insecurity:    Worry: Not on file    Inability: Not on file  . Transportation needs:    Medical: Not on file    Non-medical: Not on file  Tobacco Use  . Smoking status: Never Smoker  . Smokeless tobacco: Never Used  Substance and Sexual Activity  . Alcohol use: No  . Drug use: No  . Sexual activity: Yes    Birth control/protection: Pill, Surgical  Lifestyle  . Physical activity:    Days per week: Not on file    Minutes per session: Not on file  . Stress: Not on file  Relationships  . Social connections:    Talks on phone: Not on file    Gets together: Not on file    Attends religious service: Not on file    Active member of club or organization: Not on file    Attends meetings of clubs or organizations: Not on file    Relationship status: Not on file  Other Topics Concern  . Not on file  Social History Narrative  . Not on file   Family History: Family History  Problem Relation Age of Onset  . Cancer Mother    Colon CA  . Sudden death Neg Hx   . Hypertension Neg Hx   . Hyperlipidemia Neg Hx   . Diabetes Neg Hx   . Heart attack Neg Hx    Allergies: Allergies  Allergen Reactions  . Theophyllines    Medications: See med rec.  Review of Systems: No headache, visual changes, nausea, vomiting, diarrhea, constipation, dizziness, abdominal pain, skin rash, fevers, chills, night sweats, swollen lymph nodes, weight loss, chest pain, body aches, joint swelling, muscle aches, shortness of breath, mood changes, visual or auditory hallucinations.  Objective:    General: Well Developed, well nourished, and in no acute distress.  Neuro: Alert and oriented x3, extra-ocular muscles intact, sensation grossly intact. Cranial nerves II through XII are intact, motor, sensory, and coordinative functions are all intact. HEENT: Normocephalic, atraumatic, pupils equal round reactive to light, neck supple, no masses, no lymphadenopathy, thyroid nonpalpable. Oropharynx, nasopharynx, external ear canals are unremarkable. Skin: Warm and dry, no rashes noted.  Cardiac: Regular rate and rhythm, no murmurs rubs or gallops.  Respiratory: Clear to auscultation bilaterally. Not using accessory muscles, speaking in full sentences.  Abdominal: Soft, nontender, nondistended, positive bowel sounds, no masses, no organomegaly.  Musculoskeletal: Shoulder, elbow, wrist, hip, knee, ankle stable, and with full range of motion.  Impression and Recommendations:    The patient was counselled, risk factors were discussed, anticipatory guidance given.  Annual physical exam Annual physical as above. Up-to-date on cervical cancer, colon cancer screening. Routine labs were done at an outside facility, everything is good with the exception of lipids. She will try a low-cholesterol diet, we can recheck in 3 months.  Benign essential hypertension Doing well with losartan and occasional HCTZ.  Refilling medication. BUN, creatinine,  electrolyte are normal.  GERD (gastroesophageal reflux disease) Increasing pantoprazole to twice a day. If insufficient relief we can refer for endoscopic gastric fundoplication.  Anxiety Refilling hydroxyzine, well-controlled with 25 to 50 mg daily.  Bilateral plantar fasciitis Continue rehabilitation exercises, return for custom molded orthotics. She will probably return at the beginning of the new year. ___________________________________________ [name]. Dianah Field, M.D., ABFM., CAQSM. Primary Care and [place] Instructor of [place] of [facility] of Medicine

## 2018-08-07 ENCOUNTER — Ambulatory Visit (HOSPITAL_BASED_OUTPATIENT_CLINIC_OR_DEPARTMENT_OTHER)
Admission: RE | Admit: 2018-08-07 | Discharge: 2018-08-07 | Disposition: A | Discharge status: Home / Self Care | Payer: 59 | Source: Ambulatory Visit | Attending: Obstetrics and Gynecology | Admitting: Obstetrics and Gynecology

## 2018-08-07 DIAGNOSIS — Z1231 Encounter for screening mammogram for malignant neoplasm of breast: Secondary | ICD-10-CM | POA: Diagnosis not present

## 2018-08-23 IMAGING — MG 2D DIGITAL SCREENING BILATERAL MAMMOGRAM WITH CAD AND ADJUNCT TO
8 series · 9 of 24 positions shown · non-contrast
Comparison: Previous exam(s).

CLINICAL DATA: Screening.

EXAM:
2D DIGITAL SCREENING BILATERAL MAMMOGRAM WITH CAD AND ADJUNCT TOMO

[R MLO]
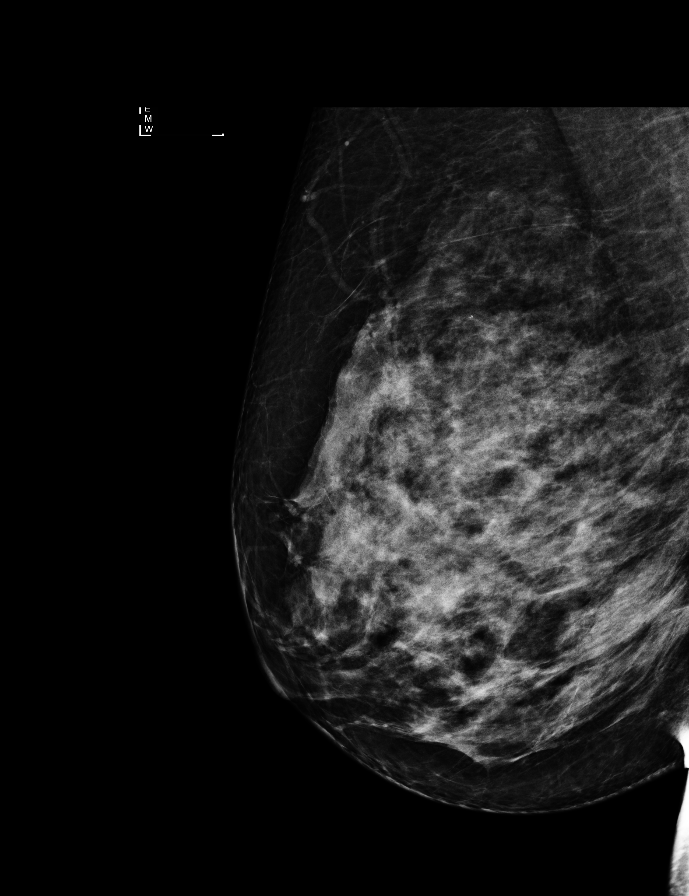

[R CC]
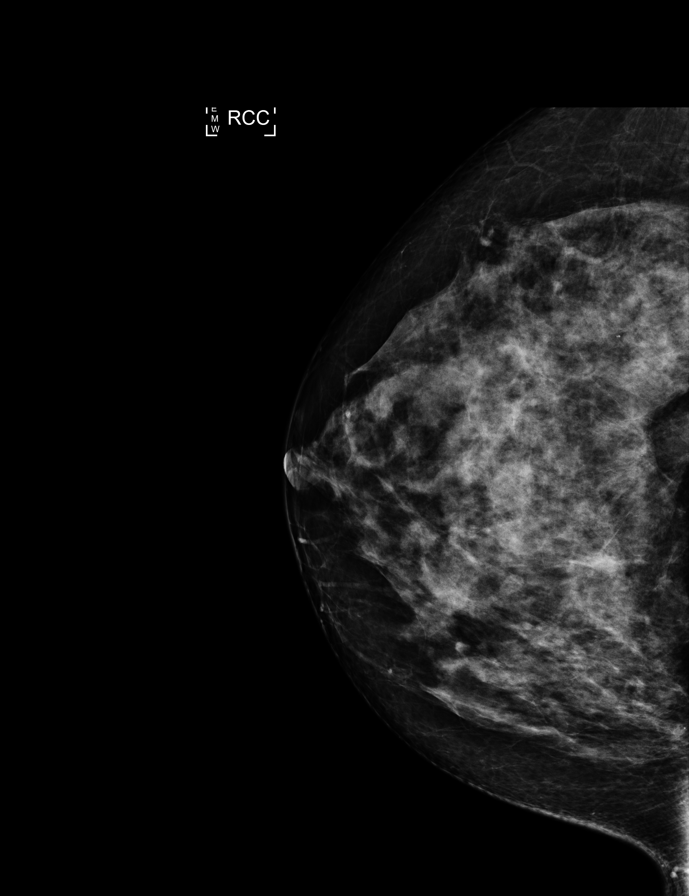

[L MLO]
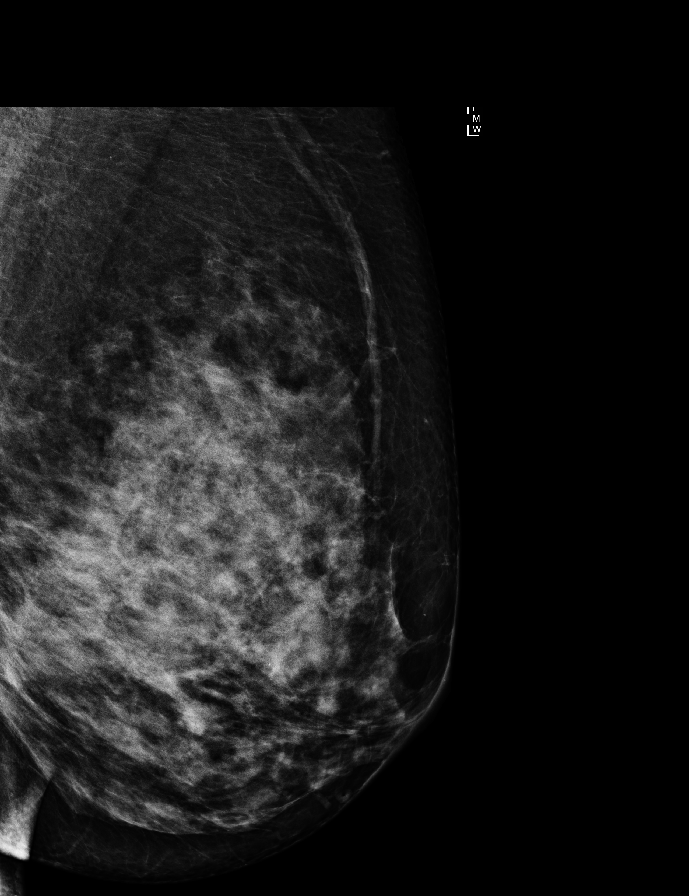

[L CC]
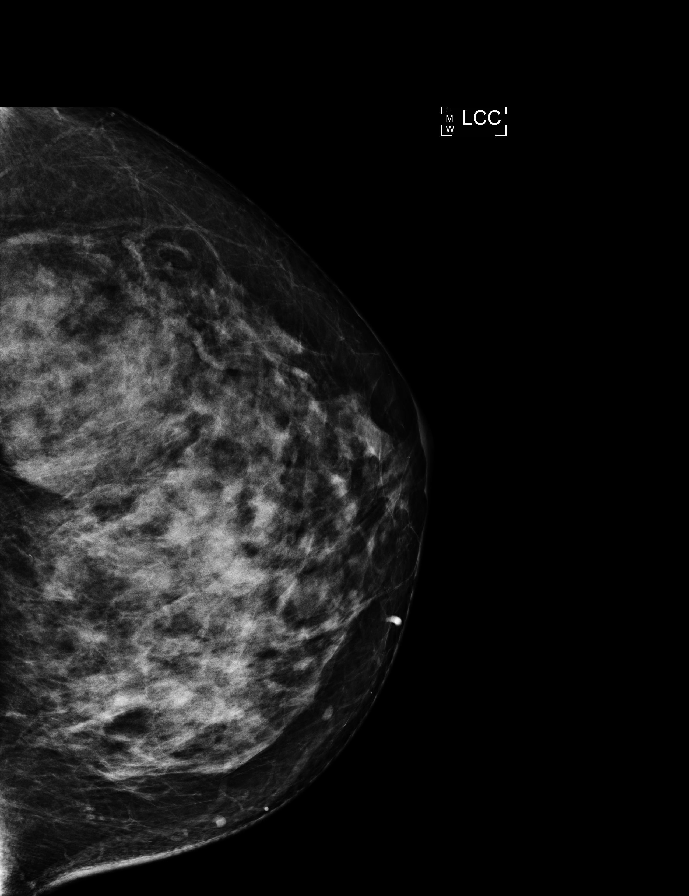

[L CC tomo · 2 of 92 frames shown]
[frame 30/92]
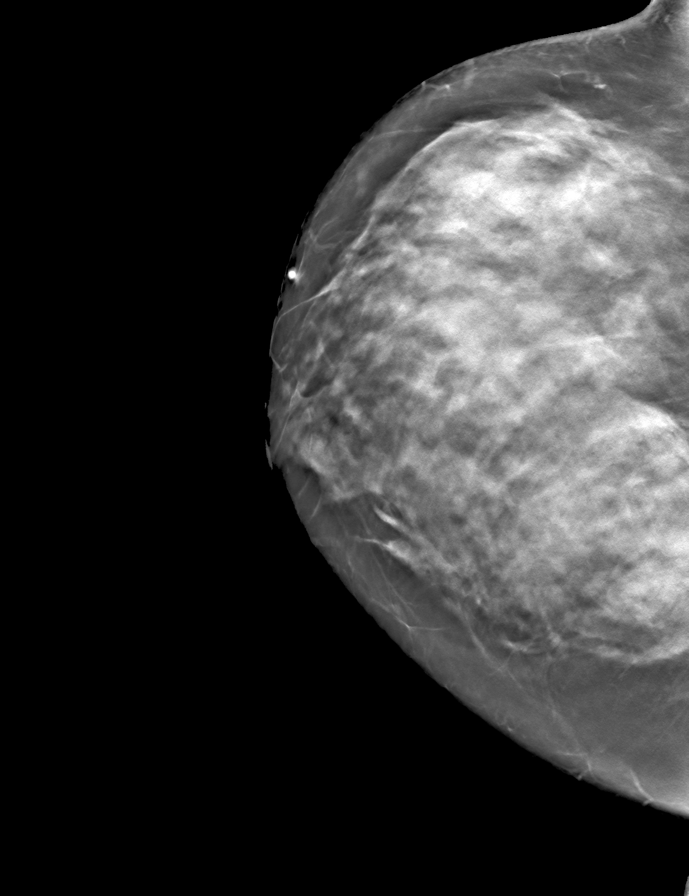
[frame 47/92]
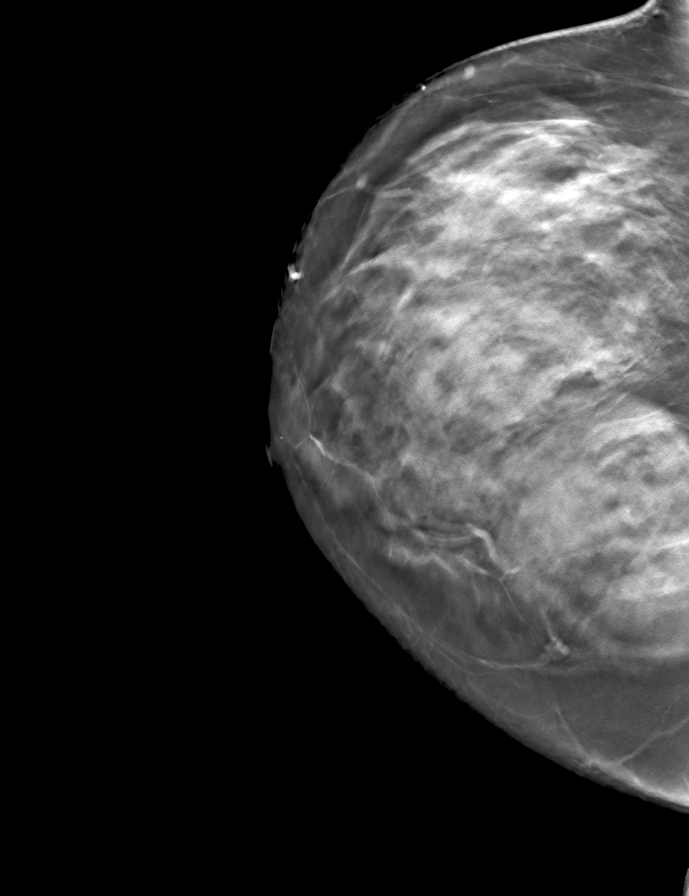

[R MLO tomo · tomo slice 45/90.0]
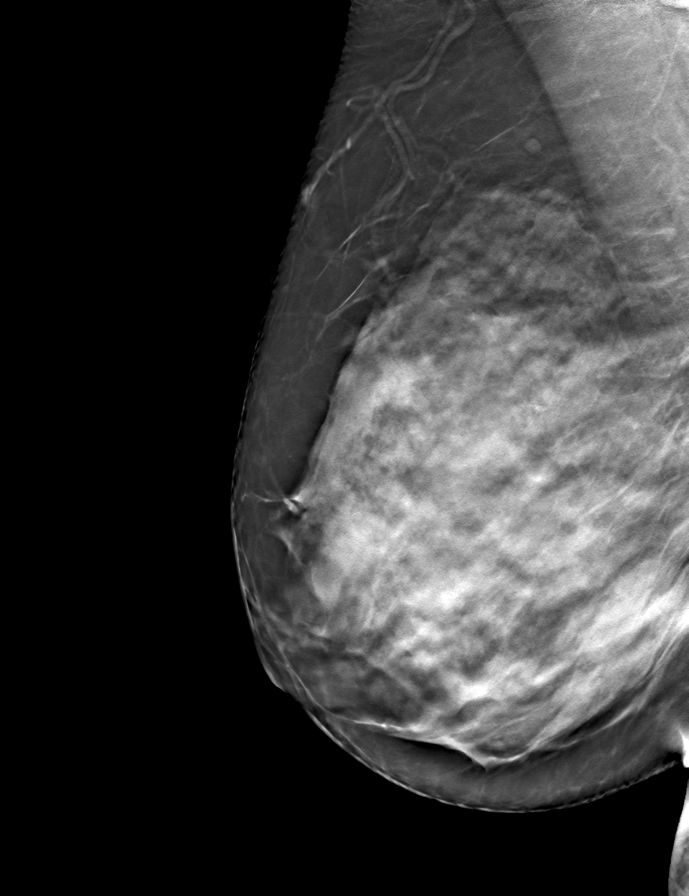

[R CC tomo · tomo slice 48/95.0]
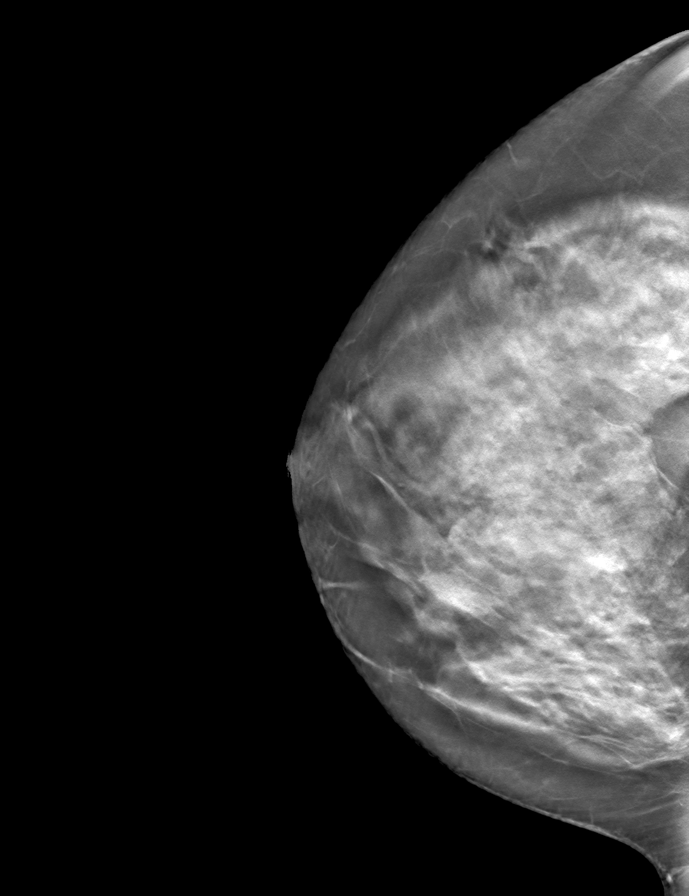

[L MLO tomo · tomo slice 51/101.0]
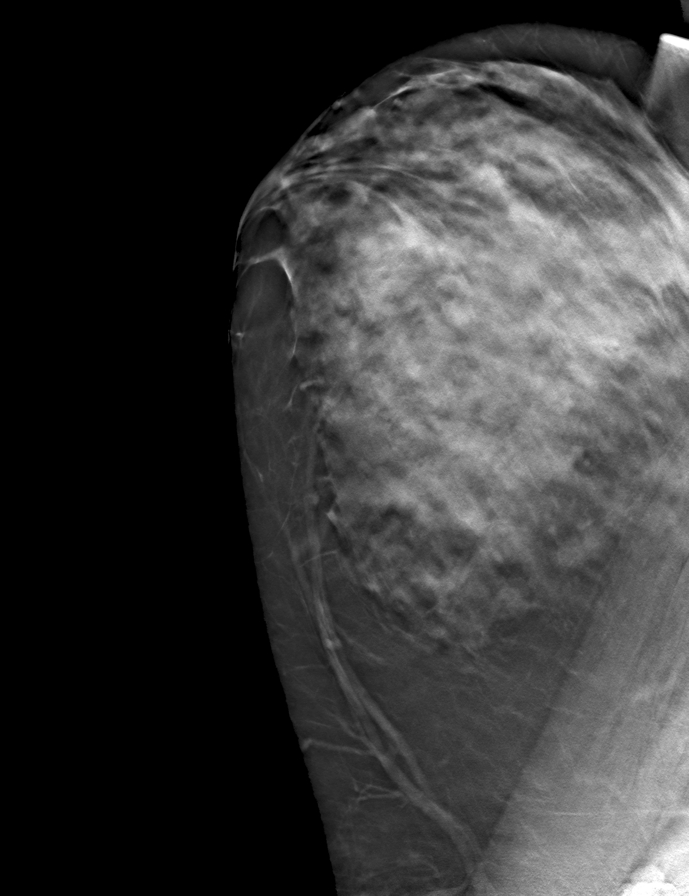

[9 of 24 positions shown; findings below may reference images not displayed]

ACR Breast Density Category c: The breast tissue is heterogeneously
dense, which may obscure small masses.
FINDINGS: There are no findings suspicious for malignancy. Images were
processed with CAD.
IMPRESSION: No mammographic evidence of malignancy. A result letter of this
screening mammogram will be mailed directly to the patient.

RECOMMENDATION:
Screening mammogram in one year. (Code:[id number])

BI-RADS CATEGORY  1: Negative.

## 2018-09-10 DIAGNOSIS — H524 Presbyopia: Secondary | ICD-10-CM | POA: Diagnosis not present

## 2018-09-10 DIAGNOSIS — H5213 Myopia, bilateral: Secondary | ICD-10-CM | POA: Diagnosis not present

## 2018-09-18 MED FILL — PANTOPRAZOLE SOD DR 40 MG T: 40 | 90 days supply | Qty: 180 | Fill #0

## 2018-09-27 MED FILL — EPINEPHRINE 0.3 MG AUTO-INJ: 0.3 | 2 days supply | Qty: 2 | Fill #0

## 2018-10-09 MED FILL — HYDROXYZINE PAM 25 MG CAP: 25 | 90 days supply | Qty: 180 | Fill #2

## 2018-12-05 MED FILL — HYDROCHLOROTHIAZIDE 25 MG T: 25 | 84 days supply | Qty: 36 | Fill #1

## 2018-12-10 DIAGNOSIS — Z7689 Persons encountering health services in other specified circumstances: Secondary | ICD-10-CM | POA: Diagnosis not present

## 2018-12-13 MED FILL — predniSONE 10 MG TABS: 10 | 15 days supply | Qty: 51 | Fill #0

## 2018-12-18 MED FILL — PANTOPRAZOLE SOD DR 40 MG T: 40 | 90 days supply | Qty: 180 | Fill #1

## 2018-12-20 MED FILL — HYDROXYZINE PAM 25 MG CAP: 25 | 90 days supply | Qty: 180 | Fill #3

## 2019-03-15 MED FILL — HYDROCHLOROTHIAZIDE 25 MG T: 25 | 84 days supply | Qty: 36 | Fill #2

## 2019-04-15 MED FILL — PANTOPRAZOLE SOD DR 40 MG T: 40 | 90 days supply | Qty: 180 | Fill #2

## 2019-04-15 MED FILL — HYDROXYZINE PAM 25 MG CAP: 25 | 90 days supply | Qty: 180 | Fill #0

## 2019-05-13 ENCOUNTER — Other Ambulatory Visit (HOSPITAL_BASED_OUTPATIENT_CLINIC_OR_DEPARTMENT_OTHER): Payer: Self-pay | Admitting: Obstetrics and Gynecology

## 2019-05-13 DIAGNOSIS — Z1239 Encounter for other screening for malignant neoplasm of breast: Secondary | ICD-10-CM

## 2019-07-17 MED FILL — HYDROXYZINE PAM 25 MG CAP: 25 | 90 days supply | Qty: 180 | Fill #1

## 2019-07-17 MED FILL — PANTOPRAZOLE SOD DR 40 MG T: 40 | 90 days supply | Qty: 180 | Fill #3

## 2019-07-17 MED FILL — LOSARTAN POTASSIUM 100 MG T: 100 | 90 days supply | Qty: 90 | Fill #1

## 2019-07-22 MED FILL — HYDROCHLOROTHIAZIDE 25 MG T: 25 | 84 days supply | Qty: 36 | Fill #3

## 2019-07-30 DIAGNOSIS — Z1151 Encounter for screening for human papillomavirus (HPV): Secondary | ICD-10-CM | POA: Diagnosis not present

## 2019-07-30 DIAGNOSIS — Z9189 Other specified personal risk factors, not elsewhere classified: Secondary | ICD-10-CM | POA: Diagnosis not present

## 2019-07-30 DIAGNOSIS — Z3049 Encounter for surveillance of other contraceptives: Secondary | ICD-10-CM | POA: Diagnosis not present

## 2019-07-30 DIAGNOSIS — R6882 Decreased libido: Secondary | ICD-10-CM | POA: Diagnosis not present

## 2019-07-30 DIAGNOSIS — Z01419 Encounter for gynecological examination (general) (routine) without abnormal findings: Secondary | ICD-10-CM | POA: Diagnosis not present

## 2019-07-30 LAB — HM PAP SMEAR

## 2019-07-31 MED FILL — PHENTERMINE 37.5 MG TABLET: 37.5 | 30 days supply | Qty: 30 | Fill #0

## 2019-08-05 ENCOUNTER — Other Ambulatory Visit: Payer: Self-pay

## 2019-08-05 ENCOUNTER — Ambulatory Visit (INDEPENDENT_AMBULATORY_CARE_PROVIDER_SITE_OTHER): Payer: 59 | Admitting: Sports Medicine

## 2019-08-05 ENCOUNTER — Encounter: Payer: Self-pay | Admitting: Sports Medicine

## 2019-08-05 VITALS — BP 136/83 | HR 97 | Ht 63.0 in | Wt 186.0 lb

## 2019-08-05 DIAGNOSIS — I10 Essential (primary) hypertension: Secondary | ICD-10-CM | POA: Diagnosis not present

## 2019-08-05 DIAGNOSIS — K219 Gastro-esophageal reflux disease without esophagitis: Secondary | ICD-10-CM

## 2019-08-05 DIAGNOSIS — Z0000 Encounter for general adult medical examination without abnormal findings: Secondary | ICD-10-CM | POA: Diagnosis not present

## 2019-08-05 DIAGNOSIS — R635 Abnormal weight gain: Secondary | ICD-10-CM | POA: Diagnosis not present

## 2019-08-05 DIAGNOSIS — F419 Anxiety disorder, unspecified: Secondary | ICD-10-CM

## 2019-08-05 MED ORDER — HYDROXYZINE HCL 25 MG PO TABS: 25.0000 mg | ORAL_TABLET | Freq: Every day | ORAL | 3 refills | Status: DC

## 2019-08-05 MED ORDER — HYDROCHLOROTHIAZIDE 25 MG PO TABS: 25.0000 mg | ORAL_TABLET | ORAL | 3 refills | Status: DC

## 2019-08-05 MED ORDER — PANTOPRAZOLE SODIUM 40 MG PO TBEC: 40.0000 mg | DELAYED_RELEASE_TABLET | Freq: Two times a day (BID) | ORAL | 3 refills | Status: DC

## 2019-08-05 MED ORDER — LOSARTAN POTASSIUM 100 MG PO TABS: 100.0000 mg | ORAL_TABLET | Freq: Every day | ORAL | 3 refills | Status: DC

## 2019-08-05 MED FILL — hydrOXYzine HCL 25 MG TABS: 25 | 90 days supply | Qty: 180 | Fill #0

## 2019-08-05 NOTE — Assessment & Plan Note (Signed)
Well-controlled with pantoprazole, refilling

## 2019-08-05 NOTE — Assessment & Plan Note (Signed)
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

## 2019-08-05 NOTE — Assessment & Plan Note (Signed)
Refilling hydroxyzine, anxiety is well controlled with it. Her mother did pass and she needed more during that period of time.

## 2019-08-05 NOTE — Assessment & Plan Note (Addendum)
Currently on phentermine prescribed by Dr. Marily Memos. She is a bit tachycardic today and irregular, so we are going to get an ECG. She can follow-up with him for this.

## 2019-08-05 NOTE — Addendum Note (Signed)
Addended by: Beatris Ship L on: 08/05/2019 10:32 AM   Modules accepted: Orders

## 2019-08-05 NOTE — Progress Notes (Signed)
Subjective:    CC: Annual physical exam  HPI:  This is a very pleasant 43 year old female, she is here for her physical, she was placed on phentermine by 1 of her occupational health providers, she is doing well but does have a relatively fast and irregular heart rate today.  Otherwise no complaints.  No chest pain, no shortness of breath, no diaphoresis, no nausea.  I reviewed the past medical history, family history, social history, surgical history, and allergies today and no changes were needed.  Please see the problem list section below in epic for further details.  Past Medical History: Past Medical History:  Diagnosis Date  . Allergy   . GERD (gastroesophageal reflux disease)   . Hypertension    Past Surgical History: Past Surgical History:  Procedure Laterality Date  . ADENOIDECTOMY    . EYE SURGERY    . JOINT REPLACEMENT    . TUBAL LIGATION     Social History: Social History   Socioeconomic History  . Marital status: Married    Spouse name: Not on file  . Number of children: Not on file  . Years of education: Not on file  . Highest education level: Not on file  Occupational History  . Not on file  Social Needs  . Financial resource strain: Not on file  . Food insecurity    Worry: Not on file    Inability: Not on file  . Transportation needs    Medical: Not on file    Non-medical: Not on file  Tobacco Use  . Smoking status: Never Smoker  . Smokeless tobacco: Never Used  Substance and Sexual Activity  . Alcohol use: No  . Drug use: No  . Sexual activity: Yes    Birth control/protection: Pill, Surgical  Lifestyle  . Physical activity    Days per week: Not on file    Minutes per session: Not on file  . Stress: Not on file  Relationships  . Social Herbalist on phone: Not on file    Gets together: Not on file    Attends religious service: Not on file    Active member of club or organization: Not on file    Attends meetings of clubs or  organizations: Not on file    Relationship status: Not on file  Other Topics Concern  . Not on file  Social History Narrative  . Not on file   Family History: Family History  Problem Relation Age of Onset  . Cancer Mother        Colon CA  . Sudden death Neg Hx   . Hypertension Neg Hx   . Hyperlipidemia Neg Hx   . Diabetes Neg Hx   . Heart attack Neg Hx    Allergies: Allergies  Allergen Reactions  . Theophyllines    Medications: See med rec.  Review of Systems: No headache, visual changes, nausea, vomiting, diarrhea, constipation, dizziness, abdominal pain, skin rash, fevers, chills, night sweats, swollen lymph nodes, weight loss, chest pain, body aches, joint swelling, muscle aches, shortness of breath, mood changes, visual or auditory hallucinations.  Objective:    General: Well Developed, well nourished, and in no acute distress.  Neuro: Alert and oriented x3, extra-ocular muscles intact, sensation grossly intact. Cranial nerves II through XII are intact, motor, sensory, and coordinative functions are all intact. HEENT: Normocephalic, atraumatic, pupils equal round reactive to light, neck supple, no masses, no lymphadenopathy, thyroid nonpalpable. Oropharynx, nasopharynx, external ear canals are  unremarkable. Skin: Warm and dry, no rashes noted.  Cardiac: Tachycardic with an irregular rhythm, no murmurs rubs or gallops.  Respiratory: Clear to auscultation bilaterally. Not using accessory muscles, speaking in full sentences.  Abdominal: Soft, nontender, nondistended, positive bowel sounds, no masses, no organomegaly.  Musculoskeletal: Shoulder, elbow, wrist, hip, knee, ankle stable, and with full range of motion.  ECG personally reviewed, ventricular rate is 92 bpm, she has a normal axis, no PR or ST segment changes, normal R wave progression.  Impression and Recommendations:    The patient was counselled, risk factors were discussed, anticipatory guidance given.  Annual  physical exam Annual physical as above, she did have her flu shot at the beginning of this month. We got routine labs from another PCP, these will be scanned in, they all look good, Trigs were only a touch elevated but not enough to treat.  Abnormal weight gain Currently on phentermine prescribed by Dr. Marily Memos. She is a bit tachycardic today and irregular, so we are going to get an ECG. She can follow-up with him for this.  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  Anxiety Refilling hydroxyzine, anxiety is well controlled with it. Her mother did pass and she needed more during that period of time.  GERD (gastroesophageal reflux disease) Well-controlled with pantoprazole, refilling   ___________________________________________ [name]. Dianah Field, M.D., ABFM., CAQSM. Primary Care and Sports Medicine [facility]  Adjunct Professor of [place] of [facility] of Medicine

## 2019-08-05 NOTE — Assessment & Plan Note (Addendum)
Annual physical as above, she did have her flu shot at the beginning of this month. We got routine labs from another PCP, these will be scanned in, they all look good, Trigs were only a touch elevated but not enough to treat.

## 2019-08-19 ENCOUNTER — Ambulatory Visit (HOSPITAL_BASED_OUTPATIENT_CLINIC_OR_DEPARTMENT_OTHER)
Admission: RE | Admit: 2019-08-19 | Discharge: 2019-08-19 | Disposition: A | Discharge status: Home / Self Care | Payer: 59 | Source: Ambulatory Visit | Attending: Obstetrics and Gynecology | Admitting: Obstetrics and Gynecology

## 2019-08-19 ENCOUNTER — Other Ambulatory Visit: Payer: Self-pay

## 2019-08-19 DIAGNOSIS — Z1231 Encounter for screening mammogram for malignant neoplasm of breast: Secondary | ICD-10-CM | POA: Insufficient documentation

## 2019-08-19 DIAGNOSIS — Z1239 Encounter for other screening for malignant neoplasm of breast: Secondary | ICD-10-CM

## 2019-09-03 MED FILL — PHENTERMINE 37.5 MG TABLET: 37.5 | 30 days supply | Qty: 30 | Fill #1

## 2019-09-16 DIAGNOSIS — H524 Presbyopia: Secondary | ICD-10-CM | POA: Diagnosis not present

## 2019-09-16 DIAGNOSIS — Z9889 Other specified postprocedural states: Secondary | ICD-10-CM | POA: Diagnosis not present

## 2019-09-16 DIAGNOSIS — H5213 Myopia, bilateral: Secondary | ICD-10-CM | POA: Diagnosis not present

## 2019-09-30 ENCOUNTER — Ambulatory Visit: Payer: Self-pay

## 2019-10-14 ENCOUNTER — Other Ambulatory Visit: Payer: Self-pay | Admitting: Sports Medicine

## 2019-10-14 DIAGNOSIS — F419 Anxiety disorder, unspecified: Secondary | ICD-10-CM

## 2019-10-15 MED FILL — HYDROXYZINE PAM 25 MG CAP: 25 | 90 days supply | Qty: 180 | Fill #0

## 2019-10-23 MED FILL — HYDROCHLOROTHIAZIDE 25 MG T: 25 | 84 days supply | Qty: 36 | Fill #0

## 2019-11-19 MED FILL — TRIAMCINOLONE 0.5% CREAM: 0.5 | 7 days supply | Qty: 30 | Fill #0

## 2019-11-22 MED FILL — LOSARTAN POTASSIUM 100 MG T: 100 | 90 days supply | Qty: 90 | Fill #0

## 2019-11-22 MED FILL — PANTOPRAZOLE SOD DR 40 MG T: 40 | 90 days supply | Qty: 180 | Fill #0

## 2019-12-09 MED FILL — predniSONE 10 MG TABS: 10 | 15 days supply | Qty: 51 | Fill #1

## 2019-12-09 MED FILL — METHOCARBAMOL 500 MG TABS: 500 | 10 days supply | Qty: 60 | Fill #0

## 2019-12-09 MED FILL — PHENTERMINE 37.5 MG TABLET: 37.5 | 30 days supply | Qty: 30 | Fill #2

## 2020-01-29 MED FILL — HYDROXYZINE PAM 25 MG CAP: 25 | 90 days supply | Qty: 180 | Fill #1

## 2020-02-14 MED FILL — HYDROCHLOROTHIAZIDE 25 MG T: 25 | 84 days supply | Qty: 36 | Fill #1

## 2020-02-25 MED FILL — PANTOPRAZOLE SOD DR 40 MG T: 40 | 90 days supply | Qty: 180 | Fill #1

## 2020-04-28 MED FILL — HYDROXYZINE PAM 25 MG CAP: 25 | 90 days supply | Qty: 180 | Fill #2

## 2020-05-22 MED FILL — LOSARTAN POTASSIUM 100 MG T: 100 | 90 days supply | Qty: 90 | Fill #1

## 2020-06-23 ENCOUNTER — Other Ambulatory Visit (HOSPITAL_BASED_OUTPATIENT_CLINIC_OR_DEPARTMENT_OTHER): Payer: Self-pay | Admitting: Obstetrics

## 2020-06-23 DIAGNOSIS — Z1231 Encounter for screening mammogram for malignant neoplasm of breast: Secondary | ICD-10-CM

## 2020-06-23 MED FILL — PANTOPRAZOLE SOD DR 40 MG T: 40 | 90 days supply | Qty: 180 | Fill #2

## 2020-07-06 MED FILL — HYDROCHLOROTHIAZIDE 25 MG T: 25 | 84 days supply | Qty: 36 | Fill #2

## 2020-07-07 MED FILL — FLUARIX QUADRIVALENT 0.5 ML: 0.5 | 1 days supply | Qty: 1 | Fill #0

## 2020-07-24 DIAGNOSIS — Z0100 Encounter for examination of eyes and vision without abnormal findings: Secondary | ICD-10-CM

## 2020-08-04 MED FILL — METHOCARBAMOL 500 MG TABS: 500 | 10 days supply | Qty: 60 | Fill #1

## 2020-08-05 ENCOUNTER — Ambulatory Visit (INDEPENDENT_AMBULATORY_CARE_PROVIDER_SITE_OTHER): Payer: No Typology Code available for payment source | Admitting: Sports Medicine

## 2020-08-05 ENCOUNTER — Encounter: Payer: Self-pay | Admitting: Sports Medicine

## 2020-08-05 ENCOUNTER — Other Ambulatory Visit: Payer: Self-pay

## 2020-08-05 ENCOUNTER — Other Ambulatory Visit: Payer: Self-pay | Admitting: Sports Medicine

## 2020-08-05 VITALS — BP 122/78 | HR 103 | Ht 63.0 in | Wt 194.0 lb

## 2020-08-05 DIAGNOSIS — F5222 Female sexual arousal disorder: Secondary | ICD-10-CM | POA: Diagnosis not present

## 2020-08-05 DIAGNOSIS — R635 Abnormal weight gain: Secondary | ICD-10-CM

## 2020-08-05 DIAGNOSIS — Z0000 Encounter for general adult medical examination without abnormal findings: Secondary | ICD-10-CM

## 2020-08-05 DIAGNOSIS — I10 Essential (primary) hypertension: Secondary | ICD-10-CM

## 2020-08-05 DIAGNOSIS — K219 Gastro-esophageal reflux disease without esophagitis: Secondary | ICD-10-CM

## 2020-08-05 DIAGNOSIS — F419 Anxiety disorder, unspecified: Secondary | ICD-10-CM

## 2020-08-05 MED ORDER — WEGOVY 0.5 MG/0.5ML ~~LOC~~ SOAJ: 0.5000 mg | SUBCUTANEOUS | 0 refills | Status: DC

## 2020-08-05 MED ORDER — WEGOVY 0.25 MG/0.5ML ~~LOC~~ SOAJ: 0.2500 mg | SUBCUTANEOUS | 0 refills | Status: DC

## 2020-08-05 MED ORDER — HYDROCHLOROTHIAZIDE 25 MG PO TABS: 25.0000 mg | ORAL_TABLET | ORAL | 3 refills | Status: DC

## 2020-08-05 MED ORDER — PANTOPRAZOLE SODIUM 40 MG PO TBEC: 40.0000 mg | DELAYED_RELEASE_TABLET | Freq: Two times a day (BID) | ORAL | 3 refills | Status: DC

## 2020-08-05 MED ORDER — LOSARTAN POTASSIUM 100 MG PO TABS: 100.0000 mg | ORAL_TABLET | Freq: Every day | ORAL | 3 refills | Status: DC

## 2020-08-05 MED ORDER — HYDROXYZINE PAMOATE 25 MG PO CAPS: 25.0000 mg | ORAL_CAPSULE | Freq: Three times a day (TID) | ORAL | 3 refills | Status: DC | PRN

## 2020-08-05 MED ORDER — WEGOVY 1.7 MG/0.75ML ~~LOC~~ SOAJ: 1.7000 mg | SUBCUTANEOUS | 0 refills | Status: DC

## 2020-08-05 MED ORDER — WEGOVY 1 MG/0.5ML ~~LOC~~ SOAJ: 1.0000 mg | SUBCUTANEOUS | 0 refills | Status: DC

## 2020-08-05 MED FILL — HYDROXYZINE PAM 25 MG CAP: 25 | 30 days supply | Qty: 180 | Fill #0

## 2020-08-05 MED FILL — LOSARTAN POTASSIUM 100 MG T: 100 | 90 days supply | Qty: 90 | Fill #0

## 2020-08-05 NOTE — Assessment & Plan Note (Signed)
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 We discussed Addyi today. I am going to give her some information on this.

## 2020-08-05 NOTE — Assessment & Plan Note (Signed)
Well-controlled, currently off of medications.

## 2020-08-05 NOTE — Assessment & Plan Note (Signed)
Annual physical as above, she is up-to-date on cervical cancer screening, this was sent for scanning and injury, last done in 2020, with HPV testing. Routine labs look good, these were done at [facility] and will be scanned in. She does have a mammogram coming up. Return in 1 year.

## 2020-08-05 NOTE — Assessment & Plan Note (Signed)
Starting YVOPFY. Return in 3 months to evaluate tolerance.

## 2020-08-05 NOTE — Progress Notes (Signed)
Subjective:    CC: Annual Physical Exam  HPI:  This patient is here for their annual physical  I reviewed the past medical history, family history, social history, surgical history, and allergies today and no changes were needed.  Please see the problem list section below in epic for further details.  Past Medical History: Past Medical History:  Diagnosis Date  . Allergy   . GERD (gastroesophageal reflux disease)   . Hypertension    Past Surgical History: Past Surgical History:  Procedure Laterality Date  . ADENOIDECTOMY    . EYE SURGERY    . JOINT REPLACEMENT    . TUBAL LIGATION     Social History: Social History   Socioeconomic History  . Marital status: Married    Spouse name: Not on file  . Number of children: Not on file  . Years of education: Not on file  . Highest education level: Not on file  Occupational History  . Not on file  Tobacco Use  . Smoking status: Never Smoker  . Smokeless tobacco: Never Used  Substance and Sexual Activity  . Alcohol use: No  . Drug use: No  . Sexual activity: Yes    Birth control/protection: Pill, Surgical  Other Topics Concern  . Not on file  Social History Narrative  . Not on file   Social Determinants of Health   Financial Resource Strain:   . Difficulty of Paying Living Expenses: Not on file  Food Insecurity:   . Worried About Charity fundraiser in the Last Year: Not on file  . Ran Out of Food in the Last Year: Not on file  Transportation Needs:   . Lack of Transportation (Medical): Not on file  . Lack of Transportation (Non-Medical): Not on file  Physical Activity:   . Days of Exercise per Week: Not on file  . Minutes of Exercise per Session: Not on file  Stress:   . Feeling of Stress : Not on file  Social Connections:   . Frequency of Communication with Friends and Family: Not on file  . Frequency of Social Gatherings with Friends and Family: Not on file  . Attends Religious Services: Not on file  .  Active Member of Clubs or Organizations: Not on file  . Attends Archivist Meetings: Not on file  . Marital Status: Not on file   Family History: Family History  Problem Relation Age of Onset  . Cancer Mother        Colon CA  . Sudden death Neg Hx   . Hypertension Neg Hx   . Hyperlipidemia Neg Hx   . Diabetes Neg Hx   . Heart attack Neg Hx    Allergies: Allergies  Allergen Reactions  . Theophyllines    Medications: See med rec.  Review of Systems: No headache, visual changes, nausea, vomiting, diarrhea, constipation, dizziness, abdominal pain, skin rash, fevers, chills, night sweats, swollen lymph nodes, weight loss, chest pain, body aches, joint swelling, muscle aches, shortness of breath, mood changes, visual or auditory hallucinations.  Objective:    General: Well Developed, well nourished, and in no acute distress.  Neuro: Alert and oriented x3, extra-ocular muscles intact, sensation grossly intact. Cranial nerves II through XII are intact, motor, sensory, and coordinative functions are all intact. HEENT: Normocephalic, atraumatic, pupils equal round reactive to light, neck supple, no masses, no lymphadenopathy, thyroid nonpalpable. Oropharynx, nasopharynx, external ear canals are unremarkable. Skin: Warm and dry, no rashes noted.  Cardiac: Regular rate  and rhythm, no murmurs rubs or gallops.  Respiratory: Clear to auscultation bilaterally. Not using accessory muscles, speaking in full sentences.  Abdominal: Soft, nontender, nondistended, positive bowel sounds, no masses, no organomegaly.  Musculoskeletal: Shoulder, elbow, wrist, hip, knee, ankle stable, and with full range of motion.  Impression and Recommendations:    The patient was counselled, risk factors were discussed, anticipatory guidance given.  Annual physical exam Annual physical as above, she is up-to-date on cervical cancer screening, this was sent for scanning and injury, last done in 2020, with  HPV testing. Routine labs look good, these were done at [facility] and will be scanned in. She does have a mammogram coming up. Return in 1 year.  Anxiety Doing very well with hydroxyzine, continue this for now.  Benign essential hypertension Well-controlled, currently off of medications.  Abnormal weight gain Starting Wegovy. Return in 3 months to evaluate tolerance.  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 We discussed Addyi today. I am going to give her some information on this.   ___________________________________________ [name]. Dianah Field, M.D., ABFM., CAQSM. Primary Care and Sports Medicine [facility]  Adjunct Professor of [place] of [facility] of Medicine

## 2020-08-05 NOTE — Assessment & Plan Note (Signed)
Doing very well with hydroxyzine, continue this for now.

## 2020-08-05 NOTE — Patient Instructions (Signed)
Flibanserin oral tablets What is this medicine? FLIBANSERIN (fly BAN ser in) is used to treat hypoactive (low) sexual desire disorder (HSDD) in women who have not gone through menopause, who have not had low sexual desire in the past, and who have low sexual desire no matter the type of sexual activity, the situation, or the sexual partner. Women with HSDD have a low sexual desire that is troubling to them, and is not due to a medical or mental health problem, problems in the relationship, medicines, or drug abuse. This medicine is not for HSDD in women who have gone through menopause. This medicine is not for men. This medicine not for use to improve sexual performance. This medicine may be used for other purposes; ask your health care provider or pharmacist if you have questions. COMMON BRAND NAME(S): Addyi What should I tell my health care provider before I take this medicine? They need to know if you have any of these conditions:  dehydration  if you drink alcohol  drug abuse or addiction  heart disease  history of depression or other mental health problems  history of a drug or alcohol abuse problem  liver disease  low blood pressure  an unusual or allergic reaction to flibanserin, other medicines, foods, dyes, or preservatives  pregnant or trying to get pregnant  breast-feeding How should I use this medicine? Take this medicine by mouth with a glass of water. Do not take with grapefruit juice. Follow the directions on the prescription label. This medicine should only be taken at bedtime. Taking it at a time other than bedtime can increase your risk for side effects such as low blood pressure, fainting, accidentaly injury, and daytime drowsiness. If you drink alcohol wait at least 2 hours after you stop drinking alcohol before taking your dose at bedtime. Another choice is to skip your dose at bedtime if you drink alcohol in the evening. After taking your bedtime dose, do not  drink alcohol until the next day. Take your medicine at regular intervals. Do not take it more often than directed. Do not stop taking except on your doctor's advice. A special MedGuide will be given to you by the pharmacist with each prescription and refill. Be sure to read this information carefully each time. Talk to your pediatrician regarding the use of this medicine in children. This medicine is not for use in children. Overdosage: If you think you have taken too much of this medicine contact a poison control center or emergency room at once. NOTE: This medicine is only for you. Do not share this medicine with others. What if I miss a dose? If you miss your dose at bedtime, skip the missed dose and take the next dose at bedtime the next day. Do not take this medicine the next morning or double your next dose. What may interact with this medicine? Do not take this medicine with any of the following medications:  certain antivirals for HIV or hepatitis  certain medicines for fungal infections like fluconazole, ketoconazole, itraconazole, or posaconazole  ciprofloxacin  clarithromycin  conivaptan  diltiazem  erythromycin  grapefruit juice  nefazodone  telithromycin  verapamil This medicine may also interact with the following medications:  alcohol  birth control pills  bupropion  certain medicines for anxiety or sleep  certain medicines for seizures like carbamazepine, phenobarbital, phenytoin  certain medicines for stomach problems like cimetidine, esomeprazole, dexlansoprazole, lansoprazole, omeprazole, pantoprazole, rabeprazole, ranitidine  digoxin  diphenhydramine  etravirine  fluoxetine  fluvoxamine    ginkgo biloba  lorcaserin  narcotic medicines for pain  resveratrol  rifabutin  rifampin  rifapentine  sirolimus  St. John's Wort This list may not describe all possible interactions. Give your health care provider a list of all the  medicines, herbs, non-prescription drugs, or dietary supplements you use. Also tell them if you smoke, drink alcohol, or use illegal drugs. Some items may interact with your medicine. What should I watch for while using this medicine? Visit your doctor or health care professional for regular checks on your progress. Tell your doctor if your symptoms have not improved after you have taken this medicine for 8 weeks. You may get dizzy or drowsy. Do not drive, use machinery, or do anything that needs mental alertness for at least 6 hours after you take your dose and until you know how this medicine affects you. The risk of severe drowsiness is increased if you are also taking other medicines that cause drowsiness, or if you take this medicine during waking hours. Only take this medicine at bedtime. Alcohol can increase dizziness and drowsiness, and can increase the risk of low blood pressure or fainting spells when combined with this medicine. If you drink alcohol wait at least 2 hours after you stop drinking alcohol before taking your medicine at bedtime. Alternatively, skip your bedtime dose if you drink alcohol in the evening. After you have taken your medicine at bedtime do not drink alcohol until the following day. Do not stand or sit up quickly. This reduces the risk of dizzy or fainting spells. This medicine can cause low blood pressure, sometimes with dizziness and fainting spells. If you begin to feel dizzy or lightheaded, lie down and call for help if the symptoms don't go away. This medicine is only available through a restricted program called the ADDYI REMS Program, and can only be obtained through certified pharmacies participating in the program. For more information about the Program and a list of pharmacies that are enrolled in the Program, go to www.AddyiREMS.com or call 1-844-PINK-PILL (1-844-746-5745). What side effects may I notice from receiving this medicine? Side effects that you should  report to your doctor or health care professional as soon as possible:  allergic reactions like skin rash, itching or hives, swelling of the face, lips, or tongue  extreme drowsiness  signs and symptoms of low blood pressure like dizziness; feeling faint or lightheaded, falls; unusually weak or tired Side effects that usually do not require medical attention (report to your doctor or health care professional if they continue or are bothersome):  dry mouth  nausea  tiredness  trouble sleeping This list may not describe all possible side effects. Call your doctor for medical advice about side effects. You may report side effects to FDA at 1-800-FDA-1088. Where should I keep my medicine? Keep out of the reach of children. Store at room temperature between 15 and 30 degrees C (59 and 86 degrees F). Throw away any unused medicine after the expiration date. NOTE: This sheet is a summary. It may not cover all possible information. If you have questions about this medicine, talk to your doctor, pharmacist, or health care provider.  2020 Elsevier/Gold Standard (2018-07-24 14:13:32)  

## 2020-08-18 ENCOUNTER — Ambulatory Visit (HOSPITAL_BASED_OUTPATIENT_CLINIC_OR_DEPARTMENT_OTHER)
Admission: RE | Admit: 2020-08-18 | Discharge: 2020-08-18 | Disposition: A | Discharge status: Home / Self Care | Payer: No Typology Code available for payment source | Source: Ambulatory Visit | Attending: Obstetrics | Admitting: Obstetrics

## 2020-08-18 ENCOUNTER — Other Ambulatory Visit: Payer: Self-pay

## 2020-08-18 DIAGNOSIS — Z1231 Encounter for screening mammogram for malignant neoplasm of breast: Secondary | ICD-10-CM | POA: Insufficient documentation

## 2020-09-03 MED FILL — PANTOPRAZOLE SOD DR 40 MG T: 40 | 90 days supply | Qty: 180 | Fill #0

## 2020-09-11 ENCOUNTER — Other Ambulatory Visit (HOSPITAL_BASED_OUTPATIENT_CLINIC_OR_DEPARTMENT_OTHER): Payer: Self-pay | Admitting: Internal Medicine

## 2020-09-11 ENCOUNTER — Ambulatory Visit: Payer: No Typology Code available for payment source | Attending: Internal Medicine

## 2020-09-11 DIAGNOSIS — Z23 Encounter for immunization: Secondary | ICD-10-CM

## 2020-09-11 MED FILL — HYDROCHLOROTHIAZIDE 25 MG T: 25 | 90 days supply | Qty: 36 | Fill #0

## 2020-09-11 NOTE — Progress Notes (Signed)
   Covid-19 Vaccination Clinic  Name:  JAMILET AMBROISE    MRN: [id number] DOB: 29-May-1976  09/11/2020  Ms. Zellars was observed post Covid-19 immunization for 15 minutes without incident. She was provided with Vaccine Information Sheet and instruction to access the V-Safe system.   Ms. Waterbury was instructed to call 911 with any severe reactions post vaccine: Marland Kitchen Difficulty breathing  . Swelling of face and throat  . A fast heartbeat  . A bad rash all over body  . Dizziness and weakness   Immunizations Administered    No immunizations on file.

## 2020-10-26 MED FILL — HYDROXYZINE PAM 25 MG CAP: 25 | 30 days supply | Qty: 180 | Fill #1

## 2020-11-04 ENCOUNTER — Other Ambulatory Visit: Payer: Self-pay | Admitting: Sports Medicine

## 2020-11-04 ENCOUNTER — Encounter: Payer: Self-pay | Admitting: Sports Medicine

## 2020-11-04 ENCOUNTER — Other Ambulatory Visit: Payer: Self-pay

## 2020-11-04 ENCOUNTER — Ambulatory Visit (INDEPENDENT_AMBULATORY_CARE_PROVIDER_SITE_OTHER): Payer: No Typology Code available for payment source | Admitting: Sports Medicine

## 2020-11-04 DIAGNOSIS — F5222 Female sexual arousal disorder: Secondary | ICD-10-CM | POA: Diagnosis not present

## 2020-11-04 DIAGNOSIS — R635 Abnormal weight gain: Secondary | ICD-10-CM | POA: Diagnosis not present

## 2020-11-04 MED ORDER — ADDYI 100 MG PO TABS: 1.0000 | ORAL_TABLET | Freq: Every day | ORAL | 11 refills | Status: DC

## 2020-11-04 MED ORDER — PHENTERMINE HCL 37.5 MG PO TABS: ORAL_TABLET | ORAL | 0 refills | Status: DC

## 2020-11-04 MED FILL — PHENTERMINE 37.5 MG TABLET: 37.5 | 30 days supply | Qty: 30 | Fill #0

## 2020-11-04 NOTE — Assessment & Plan Note (Signed)
Tara [name] had good weight loss on Wegovy but was unable to tolerate it with the nausea and vomiting, which occurred for weeks at every dose. Going back to phentermine, she did lose about 15 pounds. Return monthly for weight checks and refills.

## 2020-11-04 NOTE — Assessment & Plan Note (Signed)
She would also like to go ahead and try Addyi. She has stopped topical testosterone from her gynecologist.

## 2020-11-04 NOTE — Progress Notes (Signed)
    Procedures performed today:    None.  Independent interpretation of notes and tests performed by another provider:   None.  Brief History, Exam, Impression, and Recommendations:    Abnormal weight gain Tara [name] had good weight loss on Wegovy but was unable to tolerate it with the nausea and vomiting, which occurred for weeks at every dose. Going back to phentermine, she did lose about 15 pounds. Return monthly for weight checks and refills.  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 She has stopped topical testosterone from her gynecologist.    ___________________________________________ [name]. Dianah Field, M.D., ABFM., CAQSM. Primary Care and [place] Instructor of [place] of [facility] of Medicine

## 2020-11-19 ENCOUNTER — Telehealth: Payer: Self-pay

## 2020-11-19 NOTE — Telephone Encounter (Signed)
Med PA filed through CoverMyMeds for: Addyi

## 2020-11-23 ENCOUNTER — Encounter: Payer: Self-pay | Admitting: Sports Medicine

## 2020-11-25 MED FILL — ADDYI 100 MG TABLET: 100 | 30 days supply | Qty: 30 | Fill #0

## 2020-12-02 ENCOUNTER — Other Ambulatory Visit: Payer: Self-pay

## 2020-12-02 ENCOUNTER — Ambulatory Visit (INDEPENDENT_AMBULATORY_CARE_PROVIDER_SITE_OTHER): Payer: No Typology Code available for payment source | Admitting: Sports Medicine

## 2020-12-02 ENCOUNTER — Other Ambulatory Visit: Payer: Self-pay | Admitting: Sports Medicine

## 2020-12-02 ENCOUNTER — Encounter: Payer: Self-pay | Admitting: Sports Medicine

## 2020-12-02 DIAGNOSIS — R635 Abnormal weight gain: Secondary | ICD-10-CM

## 2020-12-02 DIAGNOSIS — F5222 Female sexual arousal disorder: Secondary | ICD-10-CM | POA: Diagnosis not present

## 2020-12-02 MED ORDER — TOPIRAMATE 50 MG PO TABS: ORAL_TABLET | ORAL | 0 refills | Status: DC

## 2020-12-02 MED ORDER — PHENTERMINE HCL 37.5 MG PO TABS: ORAL_TABLET | ORAL | 0 refills | Status: DC

## 2020-12-02 MED ORDER — TOPIRAMATE 50 MG PO TABS
ORAL_TABLET | ORAL | 0 refills | Status: DC
Start: 2020-12-02 — End: 2020-12-02

## 2020-12-02 MED FILL — PHENTERMINE 37.5 MG TABLET: 37.5 | 60 days supply | Qty: 60 | Fill #0

## 2020-12-02 MED FILL — TOPIRAMATE 50 MG TABLET: 50 | 60 days supply | Qty: 120 | Fill #0

## 2020-12-02 NOTE — Assessment & Plan Note (Addendum)
Did not lose any weight on the first month of phentermine, refilling this and adding Topamax in an up taper, she is going on vacation for 6 weeks so I will go ahead and give her 2 months of phentermine.   She can come back to see me in 2 months and that will be entering the fourth month. This is a chronic disease process not at goal with pharmacologic/prescription drug management.

## 2020-12-02 NOTE — Progress Notes (Addendum)
    Procedures performed today:    None.  Independent interpretation of notes and tests performed by another provider:   None.  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 She was also able to obtain the medicine for about $20. She does not drink. No change in plan for now.  Abnormal weight gain Did not lose any weight on the first month of phentermine, refilling this and adding Topamax in an up taper, she is going on vacation for 6 weeks so I will go ahead and give her 2 months of phentermine.   She can come back to see me in 2 months and that will be entering the fourth month. This is a chronic disease process not at goal with pharmacologic/prescription drug management.    ___________________________________________ [name]. Dianah Field, M.D., ABFM., CAQSM. Primary Care and [place] Instructor of [place] of [facility] of Medicine

## 2020-12-02 NOTE — Addendum Note (Signed)
Addended by: Silverio Decamp on: 12/02/2020 11:35 AM   Modules accepted: Orders

## 2020-12-02 NOTE — Assessment & Plan Note (Signed)
This is a very pleasant 45 year old female, we had stopped her topical testosterone from gynecology and started Addyi, she has noted fantastic improvement in sexual desire, as well as orgasm. She was also able to obtain the medicine for about $20. She does not drink. No change in plan for now.

## 2020-12-08 MED FILL — PANTOPRAZOLE SOD DR 40 MG T: 40 | 90 days supply | Qty: 180 | Fill #1

## 2020-12-08 MED FILL — HYDROXYZINE PAM 25 MG CAP: 25 | 30 days supply | Qty: 180 | Fill #2

## 2020-12-18 MED FILL — ADDYI 100 MG TABLET: 100 | 30 days supply | Qty: 30 | Fill #1

## 2021-01-27 ENCOUNTER — Ambulatory Visit (INDEPENDENT_AMBULATORY_CARE_PROVIDER_SITE_OTHER): Payer: No Typology Code available for payment source

## 2021-01-27 ENCOUNTER — Other Ambulatory Visit (HOSPITAL_BASED_OUTPATIENT_CLINIC_OR_DEPARTMENT_OTHER): Payer: Self-pay

## 2021-01-27 ENCOUNTER — Other Ambulatory Visit (HOSPITAL_COMMUNITY): Payer: Self-pay

## 2021-01-27 ENCOUNTER — Ambulatory Visit (INDEPENDENT_AMBULATORY_CARE_PROVIDER_SITE_OTHER): Payer: No Typology Code available for payment source | Admitting: Sports Medicine

## 2021-01-27 ENCOUNTER — Encounter: Payer: Self-pay | Admitting: Sports Medicine

## 2021-01-27 ENCOUNTER — Other Ambulatory Visit: Payer: Self-pay

## 2021-01-27 DIAGNOSIS — R635 Abnormal weight gain: Secondary | ICD-10-CM | POA: Diagnosis not present

## 2021-01-27 DIAGNOSIS — F5222 Female sexual arousal disorder: Secondary | ICD-10-CM

## 2021-01-27 DIAGNOSIS — Z09 Encounter for follow-up examination after completed treatment for conditions other than malignant neoplasm: Secondary | ICD-10-CM

## 2021-01-27 DIAGNOSIS — M1711 Unilateral primary osteoarthritis, right knee: Secondary | ICD-10-CM

## 2021-01-27 MED ORDER — PHENTERMINE HCL 37.5 MG PO TABS
ORAL_TABLET | Freq: Every morning | ORAL | 0 refills | Status: DC
  Filled 2021-01-27: qty 30, 30d supply, fill #0

## 2021-01-27 MED ORDER — FLIBANSERIN 100 MG PO TABS
1.0000 | ORAL_TABLET | Freq: Every day | ORAL | 3 refills | Status: DC
  Filled 2021-01-27: qty 90, 90d supply, fill #0
  Filled 2021-02-01 – 2021-02-03 (×2): qty 30, 30d supply, fill #0
  Filled 2021-03-11 (×2): qty 30, 30d supply, fill #1
  Filled 2021-04-12: qty 30, 30d supply, fill #2
  Filled 2021-05-19: qty 30, 30d supply, fill #3

## 2021-01-27 MED ORDER — TOPIRAMATE 50 MG PO TABS
50.0000 mg | ORAL_TABLET | Freq: Two times a day (BID) | ORAL | 0 refills | Status: DC
Start: 2021-01-27 — End: 2021-02-24
  Filled 2021-01-27: qty 60, 30d supply, fill #0

## 2021-01-27 MED ORDER — FLIBANSERIN 100 MG PO TABS
1.0000 | ORAL_TABLET | Freq: Every day | ORAL | 3 refills | Status: DC
  Filled 2021-01-27: qty 90, fill #0

## 2021-01-27 NOTE — Assessment & Plan Note (Signed)
Lost additional 3 pounds, entering the fourth month, continue Topamax and phentermine.

## 2021-01-27 NOTE — Progress Notes (Signed)
    Procedures performed today:    None.  Independent interpretation of notes and tests performed by another provider:   None.  Brief History, Exam, Impression, and Recommendations:    Abnormal weight gain Lost additional 3 pounds, entering the fourth month, continue Topamax and phentermine.  Primary osteoarthritis of right knee Aleve seems to work well, she is enjoying walking but would like to jog. Getting some x-rays.   Adding home rehab exercises, after she loses a significant amount of weight we will consider viscosupplementation if needed.    ___________________________________________ [name]. Dianah Field, M.D., ABFM., CAQSM. Primary Care and [place] Instructor of [place] of [facility] of Medicine

## 2021-01-27 NOTE — Assessment & Plan Note (Signed)
Aleve seems to work well, she is enjoying walking but would like to jog. Getting some x-rays.   Adding home rehab exercises, after she loses a significant amount of weight we will consider viscosupplementation if needed.

## 2021-02-01 ENCOUNTER — Other Ambulatory Visit (HOSPITAL_COMMUNITY): Payer: Self-pay

## 2021-02-03 ENCOUNTER — Other Ambulatory Visit (HOSPITAL_COMMUNITY): Payer: Self-pay

## 2021-02-08 ENCOUNTER — Other Ambulatory Visit (HOSPITAL_COMMUNITY): Payer: Self-pay

## 2021-02-24 ENCOUNTER — Ambulatory Visit (INDEPENDENT_AMBULATORY_CARE_PROVIDER_SITE_OTHER): Payer: No Typology Code available for payment source | Admitting: Sports Medicine

## 2021-02-24 ENCOUNTER — Other Ambulatory Visit (HOSPITAL_BASED_OUTPATIENT_CLINIC_OR_DEPARTMENT_OTHER): Payer: Self-pay

## 2021-02-24 ENCOUNTER — Other Ambulatory Visit: Payer: Self-pay

## 2021-02-24 ENCOUNTER — Telehealth: Payer: Self-pay | Admitting: Sports Medicine

## 2021-02-24 ENCOUNTER — Encounter: Payer: Self-pay | Admitting: Sports Medicine

## 2021-02-24 DIAGNOSIS — R635 Abnormal weight gain: Secondary | ICD-10-CM | POA: Diagnosis not present

## 2021-02-24 DIAGNOSIS — M1711 Unilateral primary osteoarthritis, right knee: Secondary | ICD-10-CM

## 2021-02-24 MED ORDER — TOPIRAMATE 50 MG PO TABS
50.0000 mg | ORAL_TABLET | Freq: Two times a day (BID) | ORAL | 0 refills | Status: DC
  Filled 2021-02-24: qty 60, 30d supply, fill #0

## 2021-02-24 MED ORDER — PHENTERMINE HCL 37.5 MG PO TABS
ORAL_TABLET | Freq: Every morning | ORAL | 0 refills | Status: DC
  Filled 2021-02-24: qty 30, 30d supply, fill #0

## 2021-02-24 NOTE — Progress Notes (Signed)
    Procedures performed today:    None.  Independent interpretation of notes and tests performed by another provider:   None.  Brief History, Exam, Impression, and Recommendations:    Abnormal weight gain Additional 10 pound weight loss, entering the fifth month of weight loss treatment, continue Topamax and phentermine. She is doing an excellent job of exercising as well, she is jogging, drinking plenty of water. I did give her an exercise prescription today.  Primary osteoarthritis of right knee Still with some pain, Aleve was working well, x-rays confirm mild osteoarthritis, I would like to go ahead and get her approved for viscosupplementation in the right knee but we wont pull the trigger to start just yet.    ___________________________________________ [name]. Dianah Field, M.D., ABFM., CAQSM. Primary Care and [place] Instructor of [place] of [facility] of Medicine

## 2021-02-24 NOTE — Assessment & Plan Note (Signed)
Additional 10 pound weight loss, entering the fifth month of weight loss treatment, continue Topamax and phentermine. She is doing an excellent job of exercising as well, she is jogging, drinking plenty of water. I did give her an exercise prescription today.

## 2021-02-24 NOTE — Assessment & Plan Note (Signed)
Still with some pain, Aleve was working well, x-rays confirm mild osteoarthritis, I would like to go ahead and get her approved for viscosupplementation in the right knee but we wont pull the trigger to start just yet.

## 2021-02-24 NOTE — Telephone Encounter (Signed)
Tara [name] has x-ray confirmed right knee osteoarthritis, has failed conservative treatment, please get her approved for viscosupplementation right knee only.

## 2021-03-11 ENCOUNTER — Other Ambulatory Visit (HOSPITAL_COMMUNITY): Payer: Self-pay

## 2021-03-12 ENCOUNTER — Other Ambulatory Visit (HOSPITAL_COMMUNITY): Payer: Self-pay

## 2021-03-12 ENCOUNTER — Other Ambulatory Visit (HOSPITAL_BASED_OUTPATIENT_CLINIC_OR_DEPARTMENT_OTHER): Payer: Self-pay

## 2021-03-12 MED FILL — Hydrochlorothiazide Tab 25 MG: ORAL | 90 days supply | Qty: 90 | Fill #0 | Status: AC

## 2021-03-25 NOTE — Telephone Encounter (Signed)
Received BID and patient is responsible for payment until deductible (500) and out of pocket (1000.) have been met then she will be covered at 100%. A Prior Josem Kaufmann is required. I am calling patient to see if she wants the injection before I do the PA if she agrees she will pay 150.00 per visit then be billed the remainder and I will get medication authorized. - CF

## 2021-03-26 ENCOUNTER — Telehealth: Payer: Self-pay

## 2021-03-26 NOTE — Telephone Encounter (Signed)
Tara [name] w/ med review at [facility] called stating that the records she received did not show where patient had gotten any steroid injections in her right knee prior to trying for viscosupplementation. I looked and did not see anything either. Please advise.

## 2021-03-26 NOTE — Telephone Encounter (Signed)
Per Dr. Landry Corporal note he just wants to get approval now but does not want to start the injections just yet. I called [employer] and started the Prior Authorization process and faxed clinicals now waiting on determination. - CF

## 2021-03-26 NOTE — Telephone Encounter (Signed)
Does not look like she has, we can use steroids contraindicated due to weight gain and and see if that helps.

## 2021-03-29 ENCOUNTER — Encounter: Payer: Self-pay | Admitting: Sports Medicine

## 2021-03-29 ENCOUNTER — Ambulatory Visit (INDEPENDENT_AMBULATORY_CARE_PROVIDER_SITE_OTHER): Payer: No Typology Code available for payment source | Admitting: Sports Medicine

## 2021-03-29 ENCOUNTER — Ambulatory Visit (INDEPENDENT_AMBULATORY_CARE_PROVIDER_SITE_OTHER): Payer: No Typology Code available for payment source

## 2021-03-29 ENCOUNTER — Other Ambulatory Visit: Payer: Self-pay

## 2021-03-29 ENCOUNTER — Other Ambulatory Visit (HOSPITAL_BASED_OUTPATIENT_CLINIC_OR_DEPARTMENT_OTHER): Payer: Self-pay

## 2021-03-29 DIAGNOSIS — M1711 Unilateral primary osteoarthritis, right knee: Secondary | ICD-10-CM | POA: Diagnosis not present

## 2021-03-29 DIAGNOSIS — R635 Abnormal weight gain: Secondary | ICD-10-CM | POA: Diagnosis not present

## 2021-03-29 MED ORDER — TOPIRAMATE 50 MG PO TABS
50.0000 mg | ORAL_TABLET | Freq: Two times a day (BID) | ORAL | 0 refills | Status: DC
  Filled 2021-03-29: qty 60, 30d supply, fill #0

## 2021-03-29 MED ORDER — PHENTERMINE HCL 37.5 MG PO TABS
ORAL_TABLET | Freq: Every morning | ORAL | 0 refills | Status: DC
  Filled 2021-03-29: qty 30, 30d supply, fill #0

## 2021-03-29 NOTE — Assessment & Plan Note (Signed)
Tara [name] has some pain in her right knee, Aleve historically was working and x-rays confirmed osteoarthritis, viscosupplementation not approved until she feels a steroid injection so this was performed today. Return to see me in a month.

## 2021-03-29 NOTE — Progress Notes (Signed)
    Procedures performed today:    Procedure: Real-time Ultrasound Guided injection of the right knee Device: Samsung HS60  Verbal informed consent obtained.  Time-out conducted.  Noted no overlying erythema, induration, or other signs of local infection.  Skin prepped in a sterile fashion.  Local anesthesia: Topical Ethyl chloride.  With sterile technique and under real time ultrasound guidance:  Noted trace effusion, 1 cc Kenalog 40, 2 cc lidocaine, 2 cc bupivacaine injected easily Completed without difficulty  Advised to call if fevers/chills, erythema, induration, drainage, or persistent bleeding.  Images permanently stored and available for review in PACS.  Impression: Technically successful ultrasound guided injection.  Independent interpretation of notes and tests performed by another provider:   None.  Brief History, Exam, Impression, and Recommendations:    Primary osteoarthritis of right knee Tara [name] has some pain in her right knee, Aleve historically was working and x-rays confirmed osteoarthritis, viscosupplementation not approved until she feels a steroid injection so this was performed today. Return to see me in a month.  Abnormal weight gain Good continued weight loss, entering the sixth month of phentermine and Topamax treatment, she exercises, jogs, drinks plenty of water. Return to see me in 1 month at which point we will probably drop her to a half tab of phentermine.    ___________________________________________ Tara [name], M.D., ABFM., CAQSM. Primary Care and [place] Instructor of [place] of [facility] of Medicine

## 2021-03-29 NOTE — Assessment & Plan Note (Signed)
Good continued weight loss, entering the sixth month of phentermine and Topamax treatment, she exercises, jogs, drinks plenty of water. Return to see me in 1 month at which point we will probably drop her to a half tab of phentermine.

## 2021-03-30 NOTE — Telephone Encounter (Signed)
Amber spoke with [employer] and withdrew the PA for Orthovisc due to patient has not even had steroid injections until yesterday. We will submit orthorvisc when Dr. Darene Lamer actually wants to administer the injection. - CF

## 2021-04-11 ENCOUNTER — Encounter (INDEPENDENT_AMBULATORY_CARE_PROVIDER_SITE_OTHER): Payer: No Typology Code available for payment source

## 2021-04-11 DIAGNOSIS — M1711 Unilateral primary osteoarthritis, right knee: Secondary | ICD-10-CM | POA: Diagnosis not present

## 2021-04-12 ENCOUNTER — Other Ambulatory Visit (HOSPITAL_COMMUNITY): Payer: Self-pay

## 2021-04-12 NOTE — Assessment & Plan Note (Signed)
Patient replied with a MyChart message, persistent pain, posterior joint line, locking and catching, referral to Dr. Maureen Ralphs per her request and getting an MRI for surgical planning.

## 2021-04-12 NOTE — Telephone Encounter (Signed)
I spent 5 total minutes of online digital evaluation and management services. 

## 2021-04-13 ENCOUNTER — Other Ambulatory Visit (HOSPITAL_COMMUNITY): Payer: Self-pay

## 2021-04-21 ENCOUNTER — Other Ambulatory Visit (HOSPITAL_BASED_OUTPATIENT_CLINIC_OR_DEPARTMENT_OTHER): Payer: Self-pay

## 2021-04-21 MED FILL — Hydroxyzine Pamoate Cap 25 MG: ORAL | 30 days supply | Qty: 180 | Fill #0 | Status: AC

## 2021-04-21 MED FILL — Pantoprazole Sodium EC Tab 40 MG (Base Equiv): ORAL | 90 days supply | Qty: 180 | Fill #0 | Status: AC

## 2021-04-24 ENCOUNTER — Ambulatory Visit (INDEPENDENT_AMBULATORY_CARE_PROVIDER_SITE_OTHER): Payer: No Typology Code available for payment source

## 2021-04-24 ENCOUNTER — Other Ambulatory Visit: Payer: Self-pay

## 2021-04-24 DIAGNOSIS — M1711 Unilateral primary osteoarthritis, right knee: Secondary | ICD-10-CM | POA: Diagnosis not present

## 2021-04-27 ENCOUNTER — Telehealth (INDEPENDENT_AMBULATORY_CARE_PROVIDER_SITE_OTHER): Payer: No Typology Code available for payment source | Admitting: Sports Medicine

## 2021-04-27 DIAGNOSIS — R635 Abnormal weight gain: Secondary | ICD-10-CM

## 2021-04-27 DIAGNOSIS — M1711 Unilateral primary osteoarthritis, right knee: Secondary | ICD-10-CM

## 2021-04-27 NOTE — Assessment & Plan Note (Signed)
Additional 5 pound weight loss after 6 months, interestingly she did not use any phentermine over the last month, for this reason we will consider the following month her 57-month, she will restart phentermine, continue Topamax, and return to see me first week of August at which point we will drop her to a half tab of phentermine and continue Topamax.

## 2021-04-27 NOTE — Progress Notes (Signed)
   Virtual Visit via Telephone   I connected with  Tara [name]  on 04/27/21 by telephone/telehealth and verified that I am speaking with the correct person using two identifiers.   I discussed the limitations, risks, security and privacy concerns of performing an evaluation and management service by telephone, including the higher likelihood of inaccurate diagnosis and treatment, and the availability of in person appointments.  We also discussed the likely need of an additional face to face encounter for complete and high quality delivery of care.  I also discussed with the patient that there may be a patient responsible charge related to this service. The patient expressed understanding and wishes to proceed.  Provider location is in medical facility. Patient location is at their home, different from provider location. People involved in care of the patient during this telehealth encounter were myself, my nurse/medical assistant, and my front office/scheduling team member.  Review of Systems: No fevers, chills, night sweats, weight loss, chest pain, or shortness of breath.   Objective Findings:    General: Speaking full sentences, no audible heavy breathing.  Sounds alert and appropriately interactive.    Independent interpretation of tests performed by another provider:   None.  Brief History, Exam, Impression, and Recommendations:    Primary osteoarthritis of right knee Tara [name] returns in telephone visit, she had persistent pain, this is post injection a month ago, she had locking, catching, ultimately MRI was obtained that showed areas of full-thickness cartilage loss and medial meniscal degenerative tearing. She does have an appointment with Dr. Maureen Ralphs later in August. She declines any narcotics, she will just use Aleve and Tylenol. I suspect she will need an arthroscopic debridement of the meniscal tearing, microfracture, and we will probably need to do viscosupplementation after  that.  Abnormal weight gain Additional 5 pound weight loss after 6 months, interestingly she did not use any phentermine over the last month, for this reason we will consider the following month her 41-month, she will restart phentermine, continue Topamax, and return to see me first week of August at which point we will drop her to a half tab of phentermine and continue Topamax.   I discussed the above assessment and treatment plan with the patient. The patient was provided an opportunity to ask questions and all were answered. The patient agreed with the plan and demonstrated an understanding of the instructions.   The patient was advised to call back or seek an in-person evaluation if the symptoms worsen or if the condition fails to improve as anticipated.   I provided 30 minutes of verbal and non-verbal time during this encounter date, time was needed to gather information, review chart, records, communicate/coordinate with staff remotely, as well as complete documentation.  Specifically we gave her anticipatory guidance regarding what the findings were in her knee MRI, and anticipatory guidance with regards to her weight loss treatment.   ___________________________________________ [name]. Dianah Field, M.D., ABFM., CAQSM. Primary Care and Sports Medicine [facility]  Adjunct Professor of [place] of [facility] of Medicine

## 2021-04-27 NOTE — Assessment & Plan Note (Signed)
Tara [name] returns in telephone visit, she had persistent pain, this is post injection a month ago, she had locking, catching, ultimately MRI was obtained that showed areas of full-thickness cartilage loss and medial meniscal degenerative tearing. She does have an appointment with Dr. Maureen Ralphs later in August. She declines any narcotics, she will just use Aleve and Tylenol. I suspect she will need an arthroscopic debridement of the meniscal tearing, microfracture, and we will probably need to do viscosupplementation after that.

## 2021-05-19 ENCOUNTER — Other Ambulatory Visit (HOSPITAL_COMMUNITY): Payer: Self-pay

## 2021-05-20 ENCOUNTER — Other Ambulatory Visit (HOSPITAL_COMMUNITY): Payer: Self-pay

## 2021-05-21 ENCOUNTER — Other Ambulatory Visit (HOSPITAL_BASED_OUTPATIENT_CLINIC_OR_DEPARTMENT_OTHER): Payer: Self-pay

## 2021-05-21 MED FILL — Losartan Potassium Tab 100 MG: ORAL | 90 days supply | Qty: 90 | Fill #0 | Status: AC

## 2021-06-02 ENCOUNTER — Other Ambulatory Visit (HOSPITAL_BASED_OUTPATIENT_CLINIC_OR_DEPARTMENT_OTHER): Payer: Self-pay

## 2021-06-02 ENCOUNTER — Encounter: Payer: Self-pay | Admitting: Sports Medicine

## 2021-06-02 ENCOUNTER — Ambulatory Visit (INDEPENDENT_AMBULATORY_CARE_PROVIDER_SITE_OTHER): Payer: No Typology Code available for payment source | Admitting: Sports Medicine

## 2021-06-02 DIAGNOSIS — R635 Abnormal weight gain: Secondary | ICD-10-CM | POA: Diagnosis not present

## 2021-06-02 DIAGNOSIS — M1711 Unilateral primary osteoarthritis, right knee: Secondary | ICD-10-CM | POA: Diagnosis not present

## 2021-06-02 DIAGNOSIS — I10 Essential (primary) hypertension: Secondary | ICD-10-CM

## 2021-06-02 MED ORDER — PHENTERMINE HCL 37.5 MG PO TABS
ORAL_TABLET | Freq: Every morning | ORAL | 0 refills | Status: DC
  Filled 2021-06-02: qty 30, 30d supply, fill #0

## 2021-06-02 MED ORDER — TOPIRAMATE 50 MG PO TABS
50.0000 mg | ORAL_TABLET | Freq: Two times a day (BID) | ORAL | 0 refills | Status: DC
  Filled 2021-06-02: qty 60, 30d supply, fill #0

## 2021-06-02 NOTE — Progress Notes (Signed)
    Procedures performed today:    None.  Independent interpretation of notes and tests performed by another provider:   None.  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 We can use Wellbutrin as a secret weapon next month if she desires.   Primary osteoarthritis of right knee Tara [name] to have pain in Tara right knee that is significantly limiting, she has a good amount of cartilage left and does have some degenerative meniscal tearing on MRI. She will likely need debridement of Tara meniscal tear, I do not think she needs a knee replacement at this juncture, further management per orthopedic surgery.    ___________________________________________ Tara [name] Tara [name] M.D., ABFM., CAQSM. Primary Care and [place]structor of [place] [facility]

## 2021-06-02 NOTE — Assessment & Plan Note (Signed)
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 We can use Wellbutrin as a secret weapon next month if she desires.

## 2021-06-02 NOTE — Addendum Note (Signed)
Addended by: Silverio Decamp on: 06/02/2021 11:38 AM   Modules accepted: Orders

## 2021-06-02 NOTE — Assessment & Plan Note (Signed)
Tara [name] continues to have pain in her right knee that is significantly limiting, she has a good amount of cartilage left and does have some degenerative meniscal tearing on MRI. She will likely need debridement of her meniscal tear, I do not think she needs a knee replacement at this juncture, further management per orthopedic surgery.

## 2021-06-04 LAB — CBC
HCT: 41.2 % (ref 35.0–45.0)
Hemoglobin: 14 g/dL (ref 11.7–15.5)
MCH: 28.5 pg (ref 27.0–33.0)
MCHC: 34 g/dL (ref 32.0–36.0)
MCV: 83.7 fL (ref 80.0–100.0)
MPV: 11.7 fL (ref 7.5–12.5)
Platelets: 236 10*3/uL (ref 140–400)
RBC: 4.92 10*6/uL (ref 3.80–5.10)
RDW: 12.7 % (ref 11.0–15.0)
WBC: 4.2 10*3/uL (ref 3.8–10.8)

## 2021-06-04 LAB — COMPREHENSIVE METABOLIC PANEL
AG Ratio: 1.8 (calc) (ref 1.0–2.5)
ALT: 13 U/L (ref 6–29)
AST: 14 U/L (ref 10–35)
Albumin: 4.2 g/dL (ref 3.6–5.1)
Alkaline phosphatase (APISO): 41 U/L (ref 31–125)
BUN: 17 mg/dL (ref 7–25)
CO2: 24 mmol/L (ref 20–32)
Calcium: 9.2 mg/dL (ref 8.6–10.2)
Chloride: 107 mmol/L (ref 98–110)
Creat: 0.94 mg/dL (ref 0.50–0.99)
Globulin: 2.3 g/dL (calc) (ref 1.9–3.7)
Glucose, Bld: 90 mg/dL (ref 65–99)
Potassium: 4.1 mmol/L (ref 3.5–5.3)
Sodium: 138 mmol/L (ref 135–146)
Total Bilirubin: 0.5 mg/dL (ref 0.2–1.2)
Total Protein: 6.5 g/dL (ref 6.1–8.1)

## 2021-06-04 LAB — TSH: TSH: 2.79 mIU/L

## 2021-06-04 LAB — LIPID PANEL
Cholesterol: 213 mg/dL — ABNORMAL HIGH (ref <200)
HDL: 76 mg/dL (ref >=50)
LDL Cholesterol (Calc): 122 mg/dL (calc) — ABNORMAL HIGH
Non-HDL Cholesterol (Calc): 137 mg/dL (calc) — ABNORMAL HIGH (ref <130)
Total CHOL/HDL Ratio: 2.8 (calc) (ref <5.0)
Triglycerides: 63 mg/dL (ref <150)

## 2021-06-21 ENCOUNTER — Other Ambulatory Visit: Payer: Self-pay | Admitting: Sports Medicine

## 2021-06-21 ENCOUNTER — Other Ambulatory Visit (HOSPITAL_COMMUNITY): Payer: Self-pay

## 2021-06-21 ENCOUNTER — Other Ambulatory Visit (HOSPITAL_BASED_OUTPATIENT_CLINIC_OR_DEPARTMENT_OTHER): Payer: Self-pay

## 2021-06-21 DIAGNOSIS — F5222 Female sexual arousal disorder: Secondary | ICD-10-CM

## 2021-06-21 MED ORDER — ADDYI 100 MG PO TABS
1.0000 | ORAL_TABLET | Freq: Every day | ORAL | 11 refills | Status: DC
  Filled 2021-06-21: qty 30, 30d supply, fill #0
  Filled 2021-07-20: qty 30, 30d supply, fill #1
  Filled 2021-08-30 (×2): qty 30, 30d supply, fill #2
  Filled 2021-10-06: qty 30, 30d supply, fill #3

## 2021-06-22 ENCOUNTER — Other Ambulatory Visit (HOSPITAL_COMMUNITY): Payer: Self-pay

## 2021-06-23 ENCOUNTER — Other Ambulatory Visit (HOSPITAL_COMMUNITY): Payer: Self-pay

## 2021-06-24 ENCOUNTER — Ambulatory Visit: Payer: No Typology Code available for payment source | Attending: Orthopedic Surgery | Admitting: Physical Therapy

## 2021-06-24 ENCOUNTER — Other Ambulatory Visit: Payer: Self-pay

## 2021-06-24 ENCOUNTER — Encounter: Payer: Self-pay | Admitting: Physical Therapy

## 2021-06-24 DIAGNOSIS — M25561 Pain in right knee: Secondary | ICD-10-CM | POA: Diagnosis not present

## 2021-06-24 DIAGNOSIS — M6281 Muscle weakness (generalized): Secondary | ICD-10-CM | POA: Insufficient documentation

## 2021-06-24 DIAGNOSIS — M25661 Stiffness of right knee, not elsewhere classified: Secondary | ICD-10-CM | POA: Diagnosis present

## 2021-06-24 DIAGNOSIS — R600 Localized edema: Secondary | ICD-10-CM | POA: Insufficient documentation

## 2021-06-24 DIAGNOSIS — G8929 Other chronic pain: Secondary | ICD-10-CM | POA: Insufficient documentation

## 2021-06-24 DIAGNOSIS — R252 Cramp and spasm: Secondary | ICD-10-CM | POA: Diagnosis present

## 2021-06-24 NOTE — Patient Instructions (Signed)
Access Code: [email or web address] URL: [email or web address] Date: 06/24/2021 Prepared by: Glenetta Hew  Exercises Seated Quad Set - 3 x daily - 7 x weekly - 1 sets - 5 reps - 5 sec hold Long Sitting Quad Set - 3 x daily - 7 x weekly - 1 sets - 5 reps - 5 sec hold Supine Isometric Hamstring Set - 3 x daily - 7 x weekly - 1 sets - 5 reps - 5 sec hold Seated Hamstring Set - 3 x daily - 7 x weekly - 1 sets - 5 reps - 5 sec hold

## 2021-06-24 NOTE — Therapy (Signed)
[facility] [address]  [place], [place], [address] Phone: [phone]   Fax:  [phone]  Physical Therapy Evaluation  Patient Details  Name: Tara [name] MRN: [id number] Date of Birth: Oct 14, 1976 Referring Provider (PT): Alusio   Encounter Date: 06/24/2021   PT End of Session - 06/24/21 0942     Visit Number 1    Number of Visits 8    Date for PT Re-Evaluation 07/22/21    Authorization Type [facility]    PT Start Time 0825    PT Stop Time 0915    PT Time Calculation (min) 50 min    Activity Tolerance Patient tolerated treatment well;Patient limited by pain    Behavior During Therapy [facility] for tasks assessed/performed             Past Medical History:  Diagnosis Date   Allergy    GERD (gastroesophageal reflux disease)    Hypertension     Past Surgical History:  Procedure Laterality Date   ADENOIDECTOMY     EYE SURGERY     JOINT REPLACEMENT     TUBAL LIGATION      There were no vitals filed for this visit.    Subjective Assessment - 06/24/21 0830     Subjective Pt. reports history of chronic R knee pain, but she had been working hard to lose weight (lost 68lbs!) but with the knee pain she is struggling to keep weight off since it is limiting her ability to exercise.  She saw Dr. Maureen Ralphs who recommended PT to strengthen muscles in preparation for surgery.  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    Pertinent History chronic R knee pain    Limitations Standing;Walking;Other (comment)   squatting   How long can you sit comfortably? pain only with transitions from sit to stand.    How long can you stand comfortably? no limitations- but shifts weight to to L    How long can you walk comfortably? every step hurts    Diagnostic tests MRI -R degenerative meniscal tear; [place] - small Baker's cyst    Patient Stated Goals  Strengthen muscles in preparation for surgery    Currently in Pain? Yes    Pain Score 2    taking aleve and tylenol, without would be 6/10   Pain Location Knee   pain behind knee bothers her the most   Pain Orientation Right    Pain Descriptors / Indicators Aching;Throbbing    Pain Type Chronic pain;Acute pain    Pain Onset More than a month ago    Pain Frequency Constant    Aggravating Factors  walking, squatting    Pain Relieving Factors ibuprofen, tylenol, ice sleeve    Effect of Pain on Daily Activities constant pain, can't exercise, difficulty standing up                [facility] PT Assessment - 06/24/21 0001       Assessment   Medical Diagnosis OA of R knee (M17.11)    Referring Provider (PT) Alusio    Onset Date/Surgical Date --   chronic 6+ months   Hand Dominance Right    Next MD Visit 07/23/21    Prior Therapy no      Precautions   Precautions None      Restrictions   Weight Bearing Restrictions No  Balance Screen   Has the patient fallen in the past 6 months No    Has the patient had a decrease in activity level because of a fear of falling?  No    Is the patient reluctant to leave their home because of a fear of falling?  No      Prior Function   Level of Independence Independent    Vocation Full time employment    Vocation Requirements ER nurse - bending, lifting, squatting    Leisure exercise      Cognition   Overall Cognitive Status Within Functional Limits for tasks assessed      Observation/Other Assessments   Observations Pt. ambulates independently, decreased weight shift to RLE, decreased R knee extension during gait.  Noted very guarded with all R knee movements.    Focus on Therapeutic Outcomes (FOTO)  53 (knee)      ROM / Strength   AROM / PROM / Strength Strength;PROM      AROM   Overall AROM  Deficits;Due to pain    Overall AROM Comments very guarded painful R knee    AROM Assessment Site Knee    Right/Left Knee Right    Right  Knee Extension -20   -20 quad lag in sitting, in supine lacking 9 deg.   Right Knee Flexion 152      PROM   Overall PROM  Within functional limits for tasks performed    Overall PROM Comments hips equal bil, however limited IR ~ 15 deg bil, full ER      Strength   Overall Strength Deficits;Due to pain    Strength Assessment Site Hip;Knee    Right/Left Hip Right;Left    Right Hip Flexion 3/5   limited by pain   Right Hip ABduction 4+/5    Right Hip ADduction 4+/5    Left Hip Flexion 4+/5    Left Hip ABduction 4+/5    Left Hip ADduction 4+/5    Right/Left Knee Right;Left    Right Knee Flexion 3+/5    Right Knee Extension 2+/5    Left Knee Flexion 4+/5    Left Knee Extension 4+/5      Flexibility   Soft Tissue Assessment /Muscle Length yes    Hamstrings tightness R HS, SLR 60 deg    Quadriceps no tightness, but increased pressure/pain on patella with knee flexion on R      Palpation   Palpation comment pain in R knee to palpation in both hamstring and gastroc tendons medial and lateral, R MCL, R LCL                        Objective measurements completed on examination: See above findings.       [facility] PT Treatment/Exercise - 06/24/21 0001       Exercises   Exercises Knee/Hip      Knee/Hip Exercises: Supine   Quad Sets Right;5 reps   5 sec   Quad Sets Limitations poor tolerance due to pain    Other Supine Knee/Hip Exercises HS set 5 x 5 sec      Modalities   Modalities --   declined                   PT Education - 06/24/21 0938     Education Details Education on plan of care including different interventions ([place], DN, Ionto), initial HEP, anatomy, recommendations for brace (at work only for safety  due to demands of job and report of knee "giving").    Person(s) Educated Patient    Methods Explanation;Demonstration;Handout;Verbal cues    Comprehension Verbalized understanding;Returned demonstration              PT Short Term  Goals - 06/24/21 1044       PT SHORT TERM GOAL #1   Title Ind with initial HEP    Time 2    Period Weeks    Status New    Target Date 07/08/21               PT Long Term Goals - 06/24/21 1044       PT LONG TERM GOAL #1   Title Ind with progressed HEP for LE strengthening.    Time 4    Period Weeks    Status New    Target Date 07/22/21      PT LONG TERM GOAL #2   Title Pt. will demonstrated full R knee extension not limited by pain.    Baseline -20 deg quad lag with LAQ, -9 deg R knee extension in long sit    Time 4    Period Weeks    Status New    Target Date 07/22/21      PT LONG TERM GOAL #3   Title Pt. will demonstrate 4+5 RLE strength    Baseline see flowsheet, decreased R quad/hamstring strength due to pain.    Time 4    Period Weeks    Status New    Target Date 07/22/21      PT LONG TERM GOAL #4   Title Pt. will report 50% improvement in R knee pain    Time 4    Period Weeks    Status New    Target Date 07/22/21                    Plan - 06/24/21 0942     Clinical Impression Statement Tara [name] is a 45 year old female referred for R knee OA for strengthening before possible surgery.  She demonstrates constant R knee pain, tenderness in posterior knee HS and gastroc tendons, as well as MCL, LCL and patellar tendon.  She also demonstrates decreased strength in R Knee and hip (limited by pain), decreased ROM in R knee for extension, and decreased tolerance to activities like walking.  She also reports occasional giving out of knee when not wearing brace.  We discussed the brace and knee biomechanics, recommended knee brace just for work due to the nature of her work and safety concerns.  Given initial HEP, although even knee isometrices increased pain.  She is a good candidate for physical therapy and would benefit from skilled interventions to decrease pain and improve strength.    Personal Factors and Comorbidities Comorbidity 2     Comorbidities chronic knee pain, torn meniscus, knee OA, HTN    Examination-Activity Limitations Bend;Carry;Squat;Locomotion Level;Transfers    Examination-Participation Restrictions Occupation;Community Activity    Stability/Clinical Decision Making Stable/Uncomplicated    Clinical Decision Making Low    Rehab Potential Good    PT Frequency 2x / week    PT Duration 4 weeks    PT Treatment/Interventions ADLs/Self Care Home Management;Cryotherapy;Electrical Stimulation;Iontophoresis '4mg'$ /ml Dexamethasone;Moist Heat;Ultrasound;Gait training;Stair training;Functional mobility training;Therapeutic activities;Therapeutic exercise;Neuromuscular re-education;Patient/family education;Orthotic Fit/Training;Manual techniques;Passive range of motion;Dry needling;Taping;Vasopneumatic Device;Joint Manipulations    PT Next Visit Plan Ionto R knee, [place] gastro/hamstring tendons, progress HEP as tolerated for Knee strengthening.  PT Home Exercise Plan Access Code: [email or web address]    Consulted and Agree with Plan of Care Patient             Patient will benefit from skilled therapeutic intervention in order to improve the following deficits and impairments:  Abnormal gait, Decreased endurance, Decreased mobility, Difficulty walking, Increased muscle spasms, Decreased range of motion, Increased edema, Decreased activity tolerance, Decreased strength, Pain, Impaired flexibility, Increased fascial restricitons  Visit Diagnosis: Chronic pain of right knee  Stiffness of right knee, not elsewhere classified  Cramp and spasm  Localized edema  Muscle weakness (generalized)     Problem List Patient Active Problem List   Diagnosis Date Noted   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  [place], [place], [address] Phone: [phone]   Fax:  [phone]  Name: Tara [name] MRN: [id number] Date of Birth: [birth year]

## 2021-06-29 ENCOUNTER — Ambulatory Visit: Payer: No Typology Code available for payment source | Admitting: Physical Therapy

## 2021-06-29 ENCOUNTER — Encounter: Payer: Self-pay | Admitting: Physical Therapy

## 2021-06-29 ENCOUNTER — Other Ambulatory Visit: Payer: Self-pay

## 2021-06-29 DIAGNOSIS — M25661 Stiffness of right knee, not elsewhere classified: Secondary | ICD-10-CM

## 2021-06-29 DIAGNOSIS — R600 Localized edema: Secondary | ICD-10-CM

## 2021-06-29 DIAGNOSIS — G8929 Other chronic pain: Secondary | ICD-10-CM

## 2021-06-29 DIAGNOSIS — M25561 Pain in right knee: Secondary | ICD-10-CM

## 2021-06-29 DIAGNOSIS — R252 Cramp and spasm: Secondary | ICD-10-CM

## 2021-06-29 DIAGNOSIS — M6281 Muscle weakness (generalized): Secondary | ICD-10-CM

## 2021-06-29 NOTE — Patient Instructions (Signed)
Trigger Point Dry Needling  What is Trigger Point Dry Needling (DN)? DN is a physical therapy technique used to treat muscle pain and dysfunction. Specifically, DN helps deactivate muscle trigger points (muscle knots).  A thin filiform needle is used to penetrate the skin and stimulate the underlying trigger point. The goal is for a local twitch response (LTR) to occur and for the trigger point to relax. No medication of any kind is injected during the procedure.   What Does Trigger Point Dry Needling Feel Like?  The procedure feels different for each individual patient. Some patients report that they do not actually feel the needle enter the skin and overall the process is not painful. Very mild bleeding may occur. However, many patients feel a deep cramping in the muscle in which the needle was inserted. This is the local twitch response.   How Will I feel after the treatment? Soreness is normal, and the onset of soreness may not occur for a few hours. Typically this soreness does not last longer than two days.  Bruising is uncommon, however; ice can be used to decrease any possible bruising.  In rare cases feeling tired or nauseous after the treatment is normal. In addition, your symptoms may get worse before they get better, this period will typically not last longer than 24 hours.   What Can I do After My Treatment? Increase your hydration by drinking more water for the next 24 hours. You may place ice or heat on the areas treated that have become sore, however, do not use heat on inflamed or bruised areas. Heat often brings more relief post needling. You can continue your regular activities, but vigorous activity is not recommended initially after the treatment for 24 hours. DN is best combined with other physical therapy such as strengthening, stretching, and other therapies.       IONTOPHORESIS PATIENT PRECAUTIONS & CONTRAINDICATIONS:  Redness under one or both electrodes can occur.   This characterized by a uniform redness that usually disappears within 12 hours of treatment. Small pinhead size blisters may result in response to the drug.  Contact your physician if the problem persists more than 24 hours. On rare occasions, iontophoresis therapy can result in temporary skin reactions such as rash, inflammation, irritation or burns.  The skin reactions may be the result of individual sensitivity to the ionic solution used, the condition of the skin at the start of treatment, reaction to the materials in the electrodes, allergies or sensitivity to dexamethasone, or a poor connection between the patch and your skin.  Discontinue using iontophoresis if you have any of these reactions and report to your therapist. Remove the Patch or electrodes if you have any undue sensation of pain or burning during the treatment and report discomfort to your therapist. Tell your Therapist if you have had known adverse reactions to the application of electrical current. Approximate treatment time is 4-6 hours.  Remove the patch after 6 hours. The Patch can be worn during normal activity, however excessive motion where the electrodes have been placed can cause poor contact between the skin and the electrode or uneven electrical current resulting in greater risk of skin irritation. Keep out of the reach of children.   DO NOT use if you have a cardiac pacemaker or any other electrically sensitive implanted device. DO NOT use if you have a known sensitivity to dexamethasone. DO NOT use during Magnetic Resonance Imaging (MRI). DO NOT use over broken or compromised skin (e.g. sunburn,  cuts, or acne) due to the increased risk of skin reaction. DO NOT SHAVE over the area to be treated:  To establish good contact between the Patch and the skin, excessive hair may be clipped. DO NOT place the Patch or electrodes on or over your eyes, directly over your heart, or brain. DO NOT reuse the Patch or  electrodes as this may cause burns to occur.   For questions, please contact your therapist at:  [facility] [address]  [place], [place], [address] Phone: [phone]   Fax:  [phone]

## 2021-06-29 NOTE — Therapy (Addendum)
[facility] [address]  [place], [place], [address] Phone: [phone]   Fax:  [phone]  Physical Therapy Treatment  Patient Details  Name: Tara [name] MRN: [id number] Date of Birth: [birth year] Referring Provider (PT): Alusio   Encounter Date: 06/29/2021   PT End of Session - 06/29/21 1536     Visit Number 2    Number of Visits 8    Date for PT Re-Evaluation 07/22/21    Authorization Type [facility]    PT Start Time 1530    PT Stop Time [id number]    PT Time Calculation (min) 45 min    Activity Tolerance Patient tolerated treatment well;Patient limited by pain    Behavior During Therapy [facility] for tasks assessed/performed             Past Medical History:  Diagnosis Date   Allergy    GERD (gastroesophageal reflux disease)    Hypertension     Past Surgical History:  Procedure Laterality Date   ADENOIDECTOMY     EYE SURGERY     JOINT REPLACEMENT     TUBAL LIGATION      There were no vitals filed for this visit.   Subjective Assessment - 06/29/21 1530     Subjective Pt reports R knee continues to buckle, especially on stairs.  Has been doing her exercises, did some walking over the holiday even, but that hurt.    Pertinent History chronic R knee pain    Limitations Standing;Walking;Other (comment)   squatting   How long can you sit comfortably? pain only with transitions from sit to stand.    How long can you stand comfortably? no limitations- but shifts weight to to L    How long can you walk comfortably? every step hurts    Diagnostic tests MRI -R degenerative meniscal tear; [place] - small Baker's cyst    Patient Stated Goals Strengthen muscles in preparation for surgery    Currently in Pain? Yes    Pain Score 4     Pain Location Knee    Pain Orientation Right    Pain Onset More than a month ago                               [facility] Adult PT Treatment/Exercise - 06/29/21 0001        Exercises   Exercises Knee/Hip      Knee/Hip Exercises: Aerobic   Recumbent Bike L1 x 6 min      Knee/Hip Exercises: Supine   Quad Sets Strengthening;Right;10 reps    Quad Sets Limitations pushing down on pillow    Short Arc Quad Sets Strengthening;Right;2 sets;10 reps    Bridges Strengthening;Both;2 sets;10 reps    Straight Leg Raises Strengthening;Right;5 reps    Straight Leg Raises Limitations very poor tolerance initially, but able to perform after dry needling      Modalities   Modalities Iontophoresis      Iontophoresis   Type of Iontophoresis Dexamethasone    Location --   R medial knee   Dose 4 mg/ml    Time 4 hour patch      Manual Therapy   Manual Therapy Soft tissue mobilization;Other (comment)    Manual therapy comments R knee/quads    Soft tissue mobilization IASTM with stainless steel tools to R quad (lateralis and medius)    Other Manual Therapy dry needling  Trigger Point Dry Needling - 06/29/21 0001     Consent Given? Yes    Education Handout Provided Yes    Muscles Treated Lower Quadrant Vastus lateralis;Vastus medialis   Right   Vastus lateralis Response Twitch response elicited;Palpable increased muscle length    Vastus medialis Response Twitch response elicited;Palpable increased muscle length                  PT Education - 06/29/21 1812     Education Details education on dry needling and iontophroresis    Person(s) Educated Patient    Methods Explanation;Handout    Comprehension Verbalized understanding              PT Short Term Goals - 06/24/21 1044       PT SHORT TERM GOAL #1   Title Ind with initial HEP    Time 2    Period Weeks    Status New    Target Date 07/08/21               PT Long Term Goals - 06/24/21 1044       PT LONG TERM GOAL #1   Title Ind with progressed HEP for LE strengthening.    Time 4    Period Weeks    Status New    Target Date 07/22/21      PT LONG TERM GOAL #2    Title Pt. will demonstrated full R knee extension not limited by pain.    Baseline -20 deg quad lag with LAQ, -9 deg R knee extension in long sit    Time 4    Period Weeks    Status New    Target Date 07/22/21      PT LONG TERM GOAL #3   Title Pt. will demonstrate 4+5 RLE strength    Baseline see flowsheet, decreased R quad/hamstring strength due to pain.    Time 4    Period Weeks    Status New    Target Date 07/22/21      PT LONG TERM GOAL #4   Title Pt. will report 50% improvement in R knee pain    Time 4    Period Weeks    Status New    Target Date 07/22/21                   Plan - 06/29/21 1536     Clinical Impression Statement Pt. reported good compliance with HEP, but continues to have a lot of pain/pulling in quad with knee extension.  She was able to perform quad sets and SAQ, but SLR was extremely painful initially, but after IASTM to quad improved.  Because of palpable trigger points in her quad, dry needled both R vastus and lateralis, and she had immediately improvement in her ability to perform SLR without pain.  Also applied dexamethasone via iontophoresis to R medial knee over MCL/pes anserine, with instructions on removal.  She would benefit from continued skilled therapy.    Personal Factors and Comorbidities Comorbidity 2    Comorbidities chronic knee pain, torn meniscus, knee OA, HTN    Examination-Activity Limitations Bend;Carry;Squat;Locomotion Level;Transfers    Examination-Participation Restrictions Occupation;Community Activity    Stability/Clinical Decision Making Stable/Uncomplicated    Rehab Potential Good    PT Frequency 2x / week    PT Duration 4 weeks    PT Treatment/Interventions ADLs/Self Care Home Management;Cryotherapy;Electrical Stimulation;Iontophoresis '4mg'$ /ml Dexamethasone;Moist Heat;Ultrasound;Gait training;Stair training;Functional mobility training;Therapeutic activities;Therapeutic exercise;Neuromuscular  re-education;Patient/family education;Orthotic Fit/Training;Manual  techniques;Passive range of motion;Dry needling;Taping;Vasopneumatic Device;Joint Manipulations    PT Next Visit Plan Ionto R knee, [place] gastro/hamstring tendons, progress HEP as tolerated for Knee strengthening.    PT Home Exercise Plan Access Code: [email or web address]    Consulted and Agree with Plan of Care Patient             Patient will benefit from skilled therapeutic intervention in order to improve the following deficits and impairments:  Abnormal gait, Decreased endurance, Decreased mobility, Difficulty walking, Increased muscle spasms, Decreased range of motion, Increased edema, Decreased activity tolerance, Decreased strength, Pain, Impaired flexibility, Increased fascial restricitons  Visit Diagnosis: Chronic pain of right knee  Stiffness of right knee, not elsewhere classified  Cramp and spasm  Localized edema  Muscle weakness (generalized)     Problem List Patient Active Problem List   Diagnosis Date Noted   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  [place], [place], [address] Phone: [phone]   Fax:  [phone]  Name: Tara [name] MRN: [id number] Date of Birth: 04-28-1976

## 2021-06-30 ENCOUNTER — Ambulatory Visit (INDEPENDENT_AMBULATORY_CARE_PROVIDER_SITE_OTHER): Payer: No Typology Code available for payment source | Admitting: Sports Medicine

## 2021-06-30 ENCOUNTER — Encounter: Payer: Self-pay | Admitting: Sports Medicine

## 2021-06-30 ENCOUNTER — Other Ambulatory Visit (HOSPITAL_BASED_OUTPATIENT_CLINIC_OR_DEPARTMENT_OTHER): Payer: Self-pay

## 2021-06-30 DIAGNOSIS — M1711 Unilateral primary osteoarthritis, right knee: Secondary | ICD-10-CM | POA: Diagnosis not present

## 2021-06-30 DIAGNOSIS — R635 Abnormal weight gain: Secondary | ICD-10-CM

## 2021-06-30 MED ORDER — PHENTERMINE HCL 37.5 MG PO TABS
18.7500 mg | ORAL_TABLET | Freq: Every morning | ORAL | 0 refills | Status: DC
  Filled 2021-06-30: qty 45, 90d supply, fill #0

## 2021-06-30 MED ORDER — TOPIRAMATE 100 MG PO TABS
100.0000 mg | ORAL_TABLET | Freq: Two times a day (BID) | ORAL | 1 refills | Status: DC
  Filled 2021-06-30: qty 180, 90d supply, fill #0
  Filled 2021-10-08: qty 180, 90d supply, fill #1

## 2021-06-30 NOTE — Assessment & Plan Note (Signed)
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 She is continuing to lose weight but plateauing. We are dropping to a half tab phentermine, and we will do a 64-monthsupply and then stop. Increasing topiramate to 100 mg twice daily. Patient declines Wellbutrin and Plenity. Return to see me in 3 months.

## 2021-06-30 NOTE — Progress Notes (Signed)
    Procedures performed today:    None.  Independent interpretation of notes and tests performed by another provider:   None.  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 She is continuing to lose weight but plateauing. We are dropping to a half tab phentermine, and we will do a 3-monthsupply and then stop. Increasing topiramate to 100 mg twice daily. Patient declines Wellbutrin and Plenity. Return to see me in 3 months.  Primary osteoarthritis of right knee We ended up referring ASeth Baketo Dr. AWynelle Linkwith orthopedic surgery, she had degenerative meniscal tearing on MRI, she has operative intervention scheduled, she is currently doing some rehab. Happy with how things are going.    ___________________________________________ TGwen Her TDianah Field M.D., ABFM., CAQSM. Primary Care and SHermanInstructor of FConynghamof NRochester Endoscopy [facility] LLCof Medicine

## 2021-06-30 NOTE — Assessment & Plan Note (Signed)
We ended up referring Tara [name] to Dr. Wynelle Link with orthopedic surgery, she had degenerative meniscal tearing on MRI, she has operative intervention scheduled, she is currently doing some rehab. Happy with how things are going.

## 2021-07-01 ENCOUNTER — Ambulatory Visit: Payer: No Typology Code available for payment source

## 2021-07-01 ENCOUNTER — Other Ambulatory Visit: Payer: Self-pay

## 2021-07-01 DIAGNOSIS — R600 Localized edema: Secondary | ICD-10-CM

## 2021-07-01 DIAGNOSIS — M25661 Stiffness of right knee, not elsewhere classified: Secondary | ICD-10-CM

## 2021-07-01 DIAGNOSIS — G8929 Other chronic pain: Secondary | ICD-10-CM

## 2021-07-01 DIAGNOSIS — R252 Cramp and spasm: Secondary | ICD-10-CM

## 2021-07-01 DIAGNOSIS — M6281 Muscle weakness (generalized): Secondary | ICD-10-CM

## 2021-07-01 DIAGNOSIS — M25561 Pain in right knee: Secondary | ICD-10-CM | POA: Diagnosis not present

## 2021-07-01 NOTE — Therapy (Signed)
[facility] [address]  [place], [place], [address] Phone: [phone]   Fax:  [phone]  Physical Therapy Treatment  Patient Details  Name: Tara [name] MRN: [id number] Date of Birth: Jan 04, 1976 Referring Provider (PT): Alusio   Encounter Date: 07/01/2021   PT End of Session - 07/01/21 1103     Visit Number 3    Number of Visits 8    Date for PT Re-Evaluation 07/22/21    Authorization Type [facility]    PT Start Time 1016    PT Stop Time 1059    PT Time Calculation (min) 43 min    Activity Tolerance Patient tolerated treatment well;Patient limited by pain    Behavior During Therapy [facility] for tasks assessed/performed             Past Medical History:  Diagnosis Date   Allergy    GERD (gastroesophageal reflux disease)    Hypertension     Past Surgical History:  Procedure Laterality Date   ADENOIDECTOMY     EYE SURGERY     JOINT REPLACEMENT     TUBAL LIGATION      There were no vitals filed for this visit.   Subjective Assessment - 07/01/21 1018     Subjective Pt notes improvement from the DN, ionto, and STM from last session.    Pertinent History chronic R knee pain    Diagnostic tests MRI -R degenerative meniscal tear; [place] - small Baker's cyst    Patient Stated Goals Strengthen muscles in preparation for surgery    Currently in Pain? Yes    Pain Score 2     Pain Location Knee    Pain Orientation Right    Pain Descriptors / Indicators Aching;Throbbing    Pain Type Acute pain;Chronic pain                               [facility] Adult PT Treatment/Exercise - 07/01/21 0001       Exercises   Exercises Knee/Hip      Knee/Hip Exercises: Stretches   Active Hamstring Stretch Right;30 seconds;3 reps    Active Hamstring Stretch Limitations seated hip hinge with strap      Knee/Hip Exercises: Aerobic   Recumbent Bike L1 x 6 min      Knee/Hip Exercises: Standing   Hip  Extension Stengthening;Both;10 reps;Knee bent    Extension Limitations with counter support; 1# weight      Knee/Hip Exercises: Supine   Bridges Strengthening;Both;10 reps;2 sets    Bridges Limitations red TB at knees      Knee/Hip Exercises: Sidelying   Hip ABduction Strengthening;Right;10 reps    Clams red TB at knees 10 reps   cues for technique     Manual Therapy   Manual Therapy Soft tissue mobilization;Other (comment)    Manual therapy comments prone    Soft tissue mobilization STM to R medial hamstrings, gastroc                       PT Short Term Goals - 06/24/21 1044       PT SHORT TERM GOAL #1   Title Ind with initial HEP    Time 2    Period Weeks    Status New    Target Date 07/08/21               PT Long  Term Goals - 06/24/21 1044       PT LONG TERM GOAL #1   Title Ind with progressed HEP for LE strengthening.    Time 4    Period Weeks    Status New    Target Date 07/22/21      PT LONG TERM GOAL #2   Title Pt. will demonstrated full R knee extension not limited by pain.    Baseline -20 deg quad lag with LAQ, -9 deg R knee extension in long sit    Time 4    Period Weeks    Status New    Target Date 07/22/21      PT LONG TERM GOAL #3   Title Pt. will demonstrate 4+5 RLE strength    Baseline see flowsheet, decreased R quad/hamstring strength due to pain.    Time 4    Period Weeks    Status New    Target Date 07/22/21      PT LONG TERM GOAL #4   Title Pt. will report 50% improvement in R knee pain    Time 4    Period Weeks    Status New    Target Date 07/22/21                   Plan - 07/01/21 1103     Clinical Impression Statement Pt showed a good performance of the exercises but required cues instruction on proper performance of exercises. She was limited by pain in her medial hs during exercises, required modifcation during S/L hip abduction to keep knee bent to reduce pain. She noted shooting pain with a  specific area along her medial hamstring tendons but otherwise tightness palpated with tenderness. She noted improvement in gait especially during work hours from the DN, ionto, and STM from last session. She did show some R quad instability with the counter exercise today and reports buckling at times. She would benefit from more work on [place] to build stability in her R knee.    Personal Factors and Comorbidities Comorbidity 2    Comorbidities chronic knee pain, torn meniscus, knee OA, HTN    PT Frequency 2x / week    PT Duration 4 weeks    PT Treatment/Interventions ADLs/Self Care Home Management;Cryotherapy;Electrical Stimulation;Iontophoresis '4mg'$ /ml Dexamethasone;Moist Heat;Ultrasound;Gait training;Stair training;Functional mobility training;Therapeutic activities;Therapeutic exercise;Neuromuscular re-education;Patient/family education;Orthotic Fit/Training;Manual techniques;Passive range of motion;Dry needling;Taping;Vasopneumatic Device;Joint Manipulations    PT Next Visit Plan Ionto R knee, [place] gastro/hamstring tendons, progress HEP as tolerated for Knee strengthening.    PT Home Exercise Plan Access Code: [email or web address]    Consulted and Agree with Plan of Care Patient             Patient will benefit from skilled therapeutic intervention in order to improve the following deficits and impairments:  Abnormal gait, Decreased endurance, Decreased mobility, Difficulty walking, Increased muscle spasms, Decreased range of motion, Increased edema, Decreased activity tolerance, Decreased strength, Pain, Impaired flexibility, Increased fascial restricitons  Visit Diagnosis: Chronic pain of right knee  Stiffness of right knee, not elsewhere classified  Cramp and spasm  Localized edema  Muscle weakness (generalized)     Problem List Patient Active Problem List   Diagnosis Date Noted   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  [place], [place], [address] Phone: [phone]   Fax:  [phone]  Name: Tara [name] MRN: [id number] Date of Birth: 22-Mar-1976

## 2021-07-07 ENCOUNTER — Encounter: Payer: Self-pay | Admitting: Physical Therapy

## 2021-07-07 ENCOUNTER — Ambulatory Visit: Payer: No Typology Code available for payment source | Admitting: Physical Therapy

## 2021-07-07 ENCOUNTER — Other Ambulatory Visit: Payer: Self-pay

## 2021-07-07 DIAGNOSIS — G8929 Other chronic pain: Secondary | ICD-10-CM

## 2021-07-07 DIAGNOSIS — M25561 Pain in right knee: Secondary | ICD-10-CM

## 2021-07-07 DIAGNOSIS — M6281 Muscle weakness (generalized): Secondary | ICD-10-CM

## 2021-07-07 DIAGNOSIS — R252 Cramp and spasm: Secondary | ICD-10-CM

## 2021-07-07 DIAGNOSIS — M25661 Stiffness of right knee, not elsewhere classified: Secondary | ICD-10-CM

## 2021-07-07 DIAGNOSIS — R600 Localized edema: Secondary | ICD-10-CM

## 2021-07-07 NOTE — Patient Instructions (Signed)
Access Code: [email or web address] URL: [email or web address] Date: 07/07/2021 Prepared by: Glenetta Hew  Exercises Supine Bridge with Resistance Band - 1 x daily - 7 x weekly - 3 sets - 10 reps Clamshell with Resistance - 1 x daily - 7 x weekly - 3 sets - 10 reps Sidelying Hip Abduction - 1 x daily - 7 x weekly - 3 sets - 10 reps Supine Single Leg Lift - 1 x daily - 7 x weekly - 3 sets - 10 reps

## 2021-07-07 NOTE — Therapy (Signed)
[facility] [address]  [place], [place], [address] Phone: [phone]   Fax:  [phone]  Physical Therapy Treatment  Patient Details  Name: Tara [name] MRN: [id number] Date of Birth: February 20, 1976 Referring Provider (PT): Alusio   Encounter Date: 07/07/2021   PT End of Session - 07/07/21 1752     Visit Number 4    Number of Visits 8    Date for PT Re-Evaluation 07/22/21    Authorization Type [facility]    PT Start Time 1700    PT Stop Time 1750    PT Time Calculation (min) 50 min    Activity Tolerance Patient tolerated treatment well    Behavior During Therapy [facility] for tasks assessed/performed             Past Medical History:  Diagnosis Date   Allergy    GERD (gastroesophageal reflux disease)    Hypertension     Past Surgical History:  Procedure Laterality Date   ADENOIDECTOMY     EYE SURGERY     JOINT REPLACEMENT     TUBAL LIGATION      There were no vitals filed for this visit.   Subjective Assessment - 07/07/21 1703     Subjective Pt. reported her knee buckled and fell down 3 steps on Saturday.  She was walking down camper steps so no rail, fell backwards to avoid landing on knee so instead hit back on steps.   Still doing knee exercises and walking a mile a day and feels like knee is actually getting better (despite recent fall).    Pertinent History chronic R knee pain    Diagnostic tests MRI -R degenerative meniscal tear; [place] - small Baker's cyst    Patient Stated Goals Strengthen muscles in preparation for surgery    Currently in Pain? Yes    Pain Score 1     Pain Location Knee    Pain Orientation Right                               [facility] Adult PT Treatment/Exercise - 07/07/21 0001       Exercises   Exercises Knee/Hip      Knee/Hip Exercises: Aerobic   Recumbent Bike L1 x 6 min      Knee/Hip Exercises: Supine   Short Arc Quad Sets Strengthening;2 sets;10  reps;Right    Short Arc Quad Sets Limitations 3# ankle weight    Bridges Strengthening;Both;10 reps;2 sets    Bridges Limitations GTB at [employer] Leg Raises Strengthening;Right;2 sets;10 reps 3# ankle weights   Straight Leg Raises Limitations no difficulty      Knee/Hip Exercises: Sidelying   Hip ABduction Strengthening;Right;2 sets;10 reps    Clams 2 x 10 GTB at knees      Manual Therapy   Manual Therapy Soft tissue mobilization    Soft tissue mobilization IASTM to R hamstrings with s/s tools                     PT Education - 07/07/21 1752     Education Details progressed HEP    Person(s) Educated Patient    Methods Explanation;Demonstration    Comprehension Verbalized understanding;Returned demonstration              PT Short Term Goals - 07/07/21 1709       PT SHORT TERM  GOAL #1   Title Ind with initial HEP    Time 2    Period Weeks    Status Achieved    Target Date 07/08/21               PT Long Term Goals - 07/07/21 1710       PT LONG TERM GOAL #1   Title Ind with progressed HEP for LE strengthening.    Time 4    Period Weeks    Status On-going   met current     PT LONG TERM GOAL #2   Title Pt. will demonstrated full R knee extension not limited by pain.    Baseline -20 deg quad lag with LAQ, -9 deg R knee extension in long sit    Time 4    Period Weeks    Status Achieved   9/14- met, tightness back of knee with extension     PT LONG TERM GOAL #3   Title Pt. will demonstrate 4+5 RLE strength    Baseline see flowsheet, decreased R quad/hamstring strength due to pain.    Time 4    Period Weeks    Status On-going   4+/5 but still painful     PT LONG TERM GOAL #4   Title Pt. will report 50% improvement in R knee pain    Time 4    Period Weeks    Status Achieved   07/07/21- 90% improvement already   Target Date 07/22/21                   Plan - 07/07/21 1753     Clinical Impression Statement Patient has made  significant progress and reports 90% improvement in pain, but still has intermittant R knee buckling.   STG #1,LTG# 2 and #4 have been met.  She still reported HS tightness back of R knee with R knee extension, but this improved after IASTM to R hamstrings.  She was able to progress exercises today without any complaint of pain.  She would benefit from continued skilled therapy.    Comorbidities chronic knee pain, torn meniscus, knee OA, HTN    Examination-Activity Limitations Bend;Carry;Squat;Locomotion Level;Transfers    Examination-Participation Restrictions Occupation;Community Activity    Rehab Potential Good    PT Frequency 2x / week    PT Duration 4 weeks    PT Treatment/Interventions ADLs/Self Care Home Management;Cryotherapy;Electrical Stimulation;Iontophoresis 53m/ml Dexamethasone;Moist Heat;Ultrasound;Gait training;Stair training;Functional mobility training;Therapeutic activities;Therapeutic exercise;Neuromuscular re-education;Patient/family education;Orthotic Fit/Training;Manual techniques;Passive range of motion;Dry needling;Taping;Vasopneumatic Device;Joint Manipulations    PT Next Visit Plan progress HEP as tolerated, modalities PRN    PT Home Exercise Plan Access Code: [email or web address](07/07/21)    Consulted and Agree with Plan of Care Patient             Patient will benefit from skilled therapeutic intervention in order to improve the following deficits and impairments:  Abnormal gait, Decreased endurance, Decreased mobility, Difficulty walking, Increased muscle spasms, Decreased range of motion, Increased edema, Decreased activity tolerance, Decreased strength, Pain, Impaired flexibility, Increased fascial restricitons  Visit Diagnosis: Chronic pain of right knee  Stiffness of right knee, not elsewhere classified  Cramp and spasm  Localized edema  Muscle weakness (generalized)     Problem List Patient Active Problem List   Diagnosis Date Noted   Female sexual arousal  disorder 08/05/2020   Anxiety 08/02/2018   Bilateral plantar fasciitis 08/02/2018   Annual physical exam 08/01/2017   Abnormal weight gain 08/01/2017   GERD (  gastroesophageal reflux disease) 08/01/2017   Benign essential hypertension 08/01/2017   Primary osteoarthritis of right knee 08/01/2017    Rennie Natter, PT DPT 07/07/2021, 5:58 PM  [facility] [address]  [place], [place], [address] Phone: [phone]   Fax:  [phone]  Name: TAMANNA WHITSON MRN: [id number] Date of Birth: September 06, 1976

## 2021-07-08 ENCOUNTER — Encounter: Payer: Self-pay | Admitting: Physical Therapy

## 2021-07-08 ENCOUNTER — Ambulatory Visit: Payer: No Typology Code available for payment source | Admitting: Physical Therapy

## 2021-07-08 DIAGNOSIS — M6281 Muscle weakness (generalized): Secondary | ICD-10-CM

## 2021-07-08 DIAGNOSIS — M25561 Pain in right knee: Secondary | ICD-10-CM | POA: Diagnosis not present

## 2021-07-08 DIAGNOSIS — R252 Cramp and spasm: Secondary | ICD-10-CM

## 2021-07-08 DIAGNOSIS — G8929 Other chronic pain: Secondary | ICD-10-CM

## 2021-07-08 DIAGNOSIS — R600 Localized edema: Secondary | ICD-10-CM

## 2021-07-08 DIAGNOSIS — M25661 Stiffness of right knee, not elsewhere classified: Secondary | ICD-10-CM

## 2021-07-08 NOTE — Therapy (Signed)
[facility] [address]  [place], [place], [address] Phone: [phone]   Fax:  [phone]  Physical Therapy Treatment  Patient Details  Name: Tara [name] MRN: [id number] Date of Birth: Jun 03, 1976 Referring Provider (PT): Alusio   Encounter Date: 07/08/2021   PT End of Session - 07/08/21 1011     Visit Number 5    Number of Visits 8    Date for PT Re-Evaluation 07/22/21    Authorization Type [facility]    PT Start Time 1015    PT Stop Time 1059    PT Time Calculation (min) 44 min    Activity Tolerance Patient tolerated treatment well    Behavior During Therapy [facility] for tasks assessed/performed             Past Medical History:  Diagnosis Date   Allergy    GERD (gastroesophageal reflux disease)    Hypertension     Past Surgical History:  Procedure Laterality Date   ADENOIDECTOMY     EYE SURGERY     JOINT REPLACEMENT     TUBAL LIGATION      There were no vitals filed for this visit.   Subjective Assessment - 07/08/21 1015     Subjective Pt. reports she is doing well this morning.  No knee buckling since yesterday.  does still take 2 aleve at night before bed as part of "routine" to try to prevent knee pain.    Pertinent History chronic R knee pain    Diagnostic tests MRI -R degenerative meniscal tear; [place] - small Baker's cyst    Patient Stated Goals Strengthen muscles in preparation for surgery    Currently in Pain? No/denies    Pain Location Knee    Pain Orientation Right                               [facility] Adult PT Treatment/Exercise - 07/08/21 0001       Exercises   Exercises Knee/Hip      Knee/Hip Exercises: Aerobic   Recumbent Bike L3 x 6 min      Knee/Hip Exercises: Standing   Hip Abduction Stengthening;Both;2 sets;10 reps    Hip Extension Stengthening;Both;2 sets;10 reps    Lateral Step Up Both;2 sets;10 reps    Forward Step Up Both;2 sets;10 reps    Step  Down Both;2 sets;10 reps;Hand Hold: 2    Step Down Limitations dips with RLE stance    Functional Squat 2 sets;10 reps    Functional Squat Limitations chair behind for safety      Manual Therapy   Manual Therapy Soft tissue mobilization    Soft tissue mobilization IASTM to R quad to decrease tightness                       PT Short Term Goals - 07/07/21 1709       PT SHORT TERM GOAL #1   Title Ind with initial HEP    Time 2    Period Weeks    Status Achieved    Target Date 07/08/21               PT Long Term Goals - 07/07/21 1710       PT LONG TERM GOAL #1   Title Ind with progressed HEP for LE strengthening.    Time 4    Period Weeks  Status On-going   met current     PT LONG TERM GOAL #2   Title Pt. will demonstrated full R knee extension not limited by pain.    Baseline -20 deg quad lag with LAQ, -9 deg R knee extension in long sit    Time 4    Period Weeks    Status Achieved   9/14- met, tightness back of knee with extension     PT LONG TERM GOAL #3   Title Pt. will demonstrate 4+5 RLE strength    Baseline see flowsheet, decreased R quad/hamstring strength due to pain.    Time 4    Period Weeks    Status On-going   4+/5 but still painful     PT LONG TERM GOAL #4   Title Pt. will report 50% improvement in R knee pain    Time 4    Period Weeks    Status Achieved   07/07/21- 90% improvement already   Target Date 07/22/21                   Plan - 07/08/21 1049     Clinical Impression Statement Patient continues to make good progress with pain in R knee, able to tolerated progresison of exercises today to include standing exercises, still reports L knee buckling with full weight bearing on RLE with exercise, cues to engage quad and given UE support thorughout session with SBA for safety.  Educated to perform standing exercises at home with chair behind for safety in case knee buckles, also to step down "with the bad" on camper steps  to avoid another incident of knee buckling and fall.  She would benefit from continued skilled therapy.    Comorbidities chronic knee pain, torn meniscus, knee OA, HTN    Examination-Activity Limitations Bend;Carry;Squat;Locomotion Level;Transfers    Examination-Participation Restrictions Occupation;Community Activity    Rehab Potential Good    PT Frequency 2x / week    PT Duration 4 weeks    PT Treatment/Interventions ADLs/Self Care Home Management;Cryotherapy;Electrical Stimulation;Iontophoresis 16m/ml Dexamethasone;Moist Heat;Ultrasound;Gait training;Stair training;Functional mobility training;Therapeutic activities;Therapeutic exercise;Neuromuscular re-education;Patient/family education;Orthotic Fit/Training;Manual techniques;Passive range of motion;Dry needling;Taping;Vasopneumatic Device;Joint Manipulations    PT Next Visit Plan progress HEP as tolerated, modalities PRN    PT Home Exercise Plan Access Code: [email or web address](07/07/21)    Consulted and Agree with Plan of Care Patient             Patient will benefit from skilled therapeutic intervention in order to improve the following deficits and impairments:  Abnormal gait, Decreased endurance, Decreased mobility, Difficulty walking, Increased muscle spasms, Decreased range of motion, Increased edema, Decreased activity tolerance, Decreased strength, Pain, Impaired flexibility, Increased fascial restricitons  Visit Diagnosis: Chronic pain of right knee  Stiffness of right knee, not elsewhere classified  Cramp and spasm  Localized edema  Muscle weakness (generalized)     Problem List Patient Active Problem List   Diagnosis Date Noted   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 [place] [address]Phone: [phone]  Fax:  [phone] Name: Tara AXELSONMRN: [id number]  of Birth: [birth year]

## 2021-07-15 ENCOUNTER — Ambulatory Visit: Payer: No Typology Code available for payment source | Admitting: Physical Therapy

## 2021-07-15 ENCOUNTER — Other Ambulatory Visit: Payer: Self-pay

## 2021-07-15 ENCOUNTER — Encounter: Payer: Self-pay | Admitting: Physical Therapy

## 2021-07-15 DIAGNOSIS — R600 Localized edema: Secondary | ICD-10-CM

## 2021-07-15 DIAGNOSIS — R252 Cramp and spasm: Secondary | ICD-10-CM

## 2021-07-15 DIAGNOSIS — M25661 Stiffness of right knee, not elsewhere classified: Secondary | ICD-10-CM

## 2021-07-15 DIAGNOSIS — M25561 Pain in right knee: Secondary | ICD-10-CM | POA: Diagnosis not present

## 2021-07-15 DIAGNOSIS — G8929 Other chronic pain: Secondary | ICD-10-CM

## 2021-07-15 DIAGNOSIS — M6281 Muscle weakness (generalized): Secondary | ICD-10-CM

## 2021-07-15 NOTE — Patient Instructions (Signed)
Access Code: [email or web address] URL: [email or web address] Date: 07/15/2021 Prepared by: Glenetta Hew  Exercises Gastroc Stretch on Wall - 1 x daily - 7 x weekly - 1 sets - 3 reps - 30 sec hold Soleus Stretch on Wall - 1 x daily - 7 x weekly - 1 sets - 3 reps - 30 sec hold

## 2021-07-15 NOTE — Therapy (Signed)
[facility] [address]  [place], [place], [address] Phone: [phone]   Fax:  [phone]  Physical Therapy Treatment  Patient Details  Name: Tara [name] MRN: [id number] Date of Birth: June 14, 1976 Referring Provider (PT): Alusio   Encounter Date: 07/15/2021   PT End of Session - 07/15/21 1121     Visit Number 6    Number of Visits 8    Date for PT Re-Evaluation 07/22/21    Authorization Type [facility]    PT Start Time 1020    PT Stop Time 1100    PT Time Calculation (min) 40 min    Activity Tolerance Patient tolerated treatment well    Behavior During Therapy [facility] for tasks assessed/performed             Past Medical History:  Diagnosis Date   Allergy    GERD (gastroesophageal reflux disease)    Hypertension     Past Surgical History:  Procedure Laterality Date   ADENOIDECTOMY     EYE SURGERY     JOINT REPLACEMENT     TUBAL LIGATION      There were no vitals filed for this visit.   Subjective Assessment - 07/15/21 1024     Subjective Patient reports she is doing well, walking 2 miles a day.  Even loaded 2 tons of rocks over weekend with no difficulty and no knee buckling.    Pertinent History chronic R knee pain    Patient Stated Goals strengthen leg muscles    Currently in Pain? No/denies                               [facility] Adult PT Treatment/Exercise - 07/15/21 0001       Exercises   Exercises Knee/Hip      Knee/Hip Exercises: Aerobic   Recumbent Bike L3 x 6 min      Knee/Hip Exercises: Standing   Lateral Step Up Both;2 sets;10 reps    Lateral Step Up Limitations added hip abduction in standing to lateral step up    Forward Step Up Both;2 sets;10 reps    Forward Step Up Limitations added contralateral knee raise and lunge    Step Down Both;2 sets;10 reps;Hand Hold: 2    Step Down Limitations dips    Functional Squat 2 sets;10 reps    Wall Squat 3 sets;10  seconds    Wall Squat Limitations R knee pain with 2nd set, modified and no pain following    Walking with Sports Cord side stepping with RTB 2 x 10' bil    Other Standing Knee Exercises runner's stretch for gastroc and soleus 2 x 30 sec bil, added to HEP      Modalities   Modalities --   declined                    PT Education - 07/15/21 1120     Education Details progressed HEP    Person(s) Educated Patient    Methods Explanation;Demonstration;Handout    Comprehension Verbalized understanding;Returned demonstration              PT Short Term Goals - 07/07/21 1709       PT SHORT TERM GOAL #1   Title Ind with initial HEP    Time 2    Period Weeks    Status Achieved    Target Date 07/08/21  PT Long Term Goals - 07/07/21 1710       PT LONG TERM GOAL #1   Title Ind with progressed HEP for LE strengthening.    Time 4    Period Weeks    Status On-going   met current     PT LONG TERM GOAL #2   Title Pt. will demonstrated full R knee extension not limited by pain.    Baseline -20 deg quad lag with LAQ, -9 deg R knee extension in long sit    Time 4    Period Weeks    Status Achieved   9/14- met, tightness back of knee with extension     PT LONG TERM GOAL #3   Title Pt. will demonstrate 4+5 RLE strength    Baseline see flowsheet, decreased R quad/hamstring strength due to pain.    Time 4    Period Weeks    Status On-going   4+/5 but still painful     PT LONG TERM GOAL #4   Title Pt. will report 50% improvement in R knee pain    Time 4    Period Weeks    Status Achieved   07/07/21- 90% improvement already   Target Date 07/22/21                   Plan - 07/15/21 1121     Clinical Impression Statement Yaresly is making good progress, reports no incidents of knee buckling over the weekend, and has started walking 2 miles/day again.  Tolerated progression of exercises to include more dynmaic movements, which she tolerated  well.  She did have some knee pain with wall squats today, but form adjusted and no pain.  She notes that she has been externally rotating R foot, especially on stairs, given gastroc stretch for ankle mobility to see if this improves.  If she continues to not have any knee pain or buckling on track to discharge in 1 week.    Comorbidities chronic knee pain, torn meniscus, knee OA, HTN    Examination-Activity Limitations Bend;Carry;Squat;Locomotion Level;Transfers    Examination-Participation Restrictions Occupation;Community Activity    Rehab Potential Good    PT Frequency 2x / week    PT Duration 4 weeks    PT Treatment/Interventions ADLs/Self Care Home Management;Cryotherapy;Electrical Stimulation;Iontophoresis 23m/ml Dexamethasone;Moist Heat;Ultrasound;Gait training;Stair training;Functional mobility training;Therapeutic activities;Therapeutic exercise;Neuromuscular re-education;Patient/family education;Orthotic Fit/Training;Manual techniques;Passive range of motion;Dry needling;Taping;Vasopneumatic Device;Joint Manipulations    PT Next Visit Plan progress HEP as tolerated, modalities PRN - add RDLs    PT Home Exercise Plan Access Code: [email or web address](07/07/21)    Consulted and Agree with Plan of Care Patient             Patient will benefit from skilled therapeutic intervention in order to improve the following deficits and impairments:  Abnormal gait, Decreased endurance, Decreased mobility, Difficulty walking, Increased muscle spasms, Decreased range of motion, Increased edema, Decreased activity tolerance, Decreased strength, Pain, Impaired flexibility, Increased fascial restricitons  Visit Diagnosis: Chronic pain of right knee  Stiffness of right knee, not elsewhere classified  Cramp and spasm  Localized edema  Muscle weakness (generalized)     Problem List Patient Active Problem List   Diagnosis Date Noted   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

## 2021-07-20 ENCOUNTER — Encounter: Payer: Self-pay | Admitting: Physical Therapy

## 2021-07-20 ENCOUNTER — Other Ambulatory Visit (HOSPITAL_COMMUNITY): Payer: Self-pay

## 2021-07-20 ENCOUNTER — Ambulatory Visit: Payer: No Typology Code available for payment source | Admitting: Physical Therapy

## 2021-07-20 ENCOUNTER — Other Ambulatory Visit: Payer: Self-pay

## 2021-07-20 DIAGNOSIS — R600 Localized edema: Secondary | ICD-10-CM

## 2021-07-20 DIAGNOSIS — M25561 Pain in right knee: Secondary | ICD-10-CM | POA: Diagnosis not present

## 2021-07-20 DIAGNOSIS — G8929 Other chronic pain: Secondary | ICD-10-CM

## 2021-07-20 DIAGNOSIS — M25661 Stiffness of right knee, not elsewhere classified: Secondary | ICD-10-CM

## 2021-07-20 DIAGNOSIS — M6281 Muscle weakness (generalized): Secondary | ICD-10-CM

## 2021-07-20 DIAGNOSIS — R252 Cramp and spasm: Secondary | ICD-10-CM

## 2021-07-20 NOTE — Therapy (Signed)
[facility] [address]  [place], [place], [address] Phone: [phone]   Fax:  [phone]  Physical Therapy Treatment  Patient Details  Name: Tara [name] MRN: [id number] Date of Birth: 07-24-1976 Referring Provider (PT): Alusio   Encounter Date: 07/20/2021   PT End of Session - 07/20/21 1659     Visit Number 7    Number of Visits 8    Date for PT Re-Evaluation 07/22/21    Authorization Type [facility]    PT Start Time 1618    PT Stop Time 1657    PT Time Calculation (min) 39 min    Activity Tolerance Patient tolerated treatment well    Behavior During Therapy [facility] for tasks assessed/performed             Past Medical History:  Diagnosis Date   Allergy    GERD (gastroesophageal reflux disease)    Hypertension     Past Surgical History:  Procedure Laterality Date   ADENOIDECTOMY     EYE SURGERY     JOINT REPLACEMENT     TUBAL LIGATION      There were no vitals filed for this visit.   Subjective Assessment - 07/20/21 1620     Subjective Patient reports she is doing well, walking 2 miles a day.    Pertinent History chronic R knee pain    Patient Stated Goals strengthen leg muscles    Currently in Pain? Yes    Pain Score 0-No pain   1/10 after doing wall squats, no pain with walking   Pain Location Knee    Pain Orientation Right;Posterior                               [facility] Adult PT Treatment/Exercise - 07/20/21 0001       Knee/Hip Exercises: Stretches   Active Hamstring Stretch Right;3 reps;30 seconds    Active Hamstring Stretch Limitations seated hip hinge with strap      Knee/Hip Exercises: Aerobic   Recumbent Bike L3 x 6 min      Knee/Hip Exercises: Machines for Strengthening   Cybex Knee Flexion 15# B LE; 5# 10 reps each      Knee/Hip Exercises: Standing   Forward Lunges Right;10 reps    Forward Lunges Limitations TRX    Hip Abduction Stengthening;Both;10  reps    Abduction Limitations red TB    Hip Extension Stengthening;Both;10 reps    Extension Limitations red TB    Functional Squat 10 reps;2 sets;3 seconds    Functional Squat Limitations TRX    Other Standing Knee Exercises backwards monster walks 3x with red TB along the counter                       PT Short Term Goals - 07/07/21 1709       PT SHORT TERM GOAL #1   Title Ind with initial HEP    Time 2    Period Weeks    Status Achieved    Target Date 07/08/21               PT Long Term Goals - 07/07/21 1710       PT LONG TERM GOAL #1   Title Ind with progressed HEP for LE strengthening.    Time 4    Period Weeks    Status On-going   met  current     PT LONG TERM GOAL #2   Title Pt. will demonstrated full R knee extension not limited by pain.    Baseline -20 deg quad lag with LAQ, -9 deg R knee extension in long sit    Time 4    Period Weeks    Status Achieved   9/14- met, tightness back of knee with extension     PT LONG TERM GOAL #3   Title Pt. will demonstrate 4+5 RLE strength    Baseline see flowsheet, decreased R quad/hamstring strength due to pain.    Time 4    Period Weeks    Status On-going   4+/5 but still painful     PT LONG TERM GOAL #4   Title Pt. will report 50% improvement in R knee pain    Time 4    Period Weeks    Status Achieved   07/07/21- 90% improvement already   Target Date 07/22/21                   Plan - 07/20/21 1700     Clinical Impression Statement Pt has made considerable improvements with R knee pain and function since beginning PT. She notes that she is able to walk 2 miles every day now. Progressed strengthening exercises today to her tolerance, she had a mild c/o anterior knee pain during squats but not liimiting. Cues and instruction given with every exercise, good demonstration shown onced instructed. Pt w/o any complaints post session.    Comorbidities chronic knee pain, torn meniscus, knee OA, HTN     PT Frequency 2x / week    PT Duration 4 weeks    PT Treatment/Interventions ADLs/Self Care Home Management;Cryotherapy;Electrical Stimulation;Iontophoresis 59m/ml Dexamethasone;Moist Heat;Ultrasound;Gait training;Stair training;Functional mobility training;Therapeutic activities;Therapeutic exercise;Neuromuscular re-education;Patient/family education;Orthotic Fit/Training;Manual techniques;Passive range of motion;Dry needling;Taping;Vasopneumatic Device;Joint Manipulations    PT Next Visit Plan progress HEP as tolerated, modalities PRN - add RDLs    PT Home Exercise Plan Access Code: [email or web address](07/07/21)    Consulted and Agree with Plan of Care Patient             Patient will benefit from skilled therapeutic intervention in order to improve the following deficits and impairments:  Abnormal gait, Decreased endurance, Decreased mobility, Difficulty walking, Increased muscle spasms, Decreased range of motion, Increased edema, Decreased activity tolerance, Decreased strength, Pain, Impaired flexibility, Increased fascial restricitons  Visit Diagnosis: Chronic pain of right knee  Stiffness of right knee, not elsewhere classified  Cramp and spasm  Localized edema  Muscle weakness (generalized)     Problem List Patient Active Problem List   Diagnosis Date Noted   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 [place] [address]Phone: [phone]  Fax:  [phone] Name: Tara SPINKMRN: [id number] of Birth: 827-Oct-1977

## 2021-07-21 ENCOUNTER — Other Ambulatory Visit (HOSPITAL_BASED_OUTPATIENT_CLINIC_OR_DEPARTMENT_OTHER): Payer: Self-pay | Admitting: Sports Medicine

## 2021-07-21 ENCOUNTER — Ambulatory Visit: Payer: No Typology Code available for payment source | Admitting: Physical Therapy

## 2021-07-21 ENCOUNTER — Encounter: Payer: Self-pay | Admitting: Physical Therapy

## 2021-07-21 ENCOUNTER — Other Ambulatory Visit (HOSPITAL_COMMUNITY): Payer: Self-pay

## 2021-07-21 DIAGNOSIS — Z1231 Encounter for screening mammogram for malignant neoplasm of breast: Secondary | ICD-10-CM

## 2021-07-21 DIAGNOSIS — M25561 Pain in right knee: Secondary | ICD-10-CM

## 2021-07-21 DIAGNOSIS — R252 Cramp and spasm: Secondary | ICD-10-CM

## 2021-07-21 DIAGNOSIS — R600 Localized edema: Secondary | ICD-10-CM

## 2021-07-21 DIAGNOSIS — M25661 Stiffness of right knee, not elsewhere classified: Secondary | ICD-10-CM

## 2021-07-21 DIAGNOSIS — G8929 Other chronic pain: Secondary | ICD-10-CM

## 2021-07-21 DIAGNOSIS — M6281 Muscle weakness (generalized): Secondary | ICD-10-CM

## 2021-07-21 NOTE — Patient Instructions (Signed)
Access Code: [email or web address] URL: [email or web address] Date: 07/21/2021 Prepared by: Glenetta Hew  Exercises Forward T - 1 x daily - 7 x weekly - 3 sets - 10 reps

## 2021-07-21 NOTE — Therapy (Signed)
[facility] [address]  [place], [place], [address] Phone: [phone]   Fax:  [phone]  Physical Therapy Discharge  PHYSICAL THERAPY DISCHARGE SUMMARY  Visits from Start of Care: 8  Current functional level related to goals / functional outcomes: Independent, FOTO improved to 82 from 53 at intake, expected level at discharge was 77 after 13 visits.    Remaining deficits: none   Education / Equipment: HEP  Plan: Patient agrees to discharge.   Patient is being discharged due to meeting the stated rehab goals.        Patient Details  Name: Tara [name] MRN: [id number] Date of Birth: [birth year] Referring Provider (PT): Alusio   Encounter Date: 07/21/2021   PT End of Session - 07/21/21 1021     Visit Number 8    Number of Visits 8    Date for PT Re-Evaluation 07/22/21    Authorization Type [facility]    PT Start Time 1017    PT Stop Time 1100    PT Time Calculation (min) 43 min    Activity Tolerance Patient tolerated treatment well    Behavior During Therapy WFL for tasks assessed/performed             Past Medical History:  Diagnosis Date   Allergy    GERD (gastroesophageal reflux disease)    Hypertension     Past Surgical History:  Procedure Laterality Date   ADENOIDECTOMY     EYE SURGERY     JOINT REPLACEMENT     TUBAL LIGATION      There were no vitals filed for this visit.   Subjective Assessment - 07/21/21 1019     Subjective Tara [name] reports no pain in her knee.  She has returned to all activities and has not had any more incidents of knee buckling in the last 2 weeks.  Compliant with HEP.    How long can you sit comfortably? no limitations    How long can you stand comfortably? no limitations    How long can you walk comfortably? returned to walking 2 miles day    Diagnostic tests MRI -R degenerative meniscal tear; [place] - small Baker's cyst    Patient Stated Goals strengthen leg  muscles    Currently in Pain? No/denies                [facility] PT Assessment - 07/21/21 0001       Assessment   Medical Diagnosis OA of R knee (M17.11)    Referring Provider (PT) Alusio    Hand Dominance Right    Next MD Visit 07/23/21    Prior Therapy no      Precautions   Precautions None      Observation/Other Assessments   Focus on Therapeutic Outcomes (FOTO)  62                           [facility] Adult PT Treatment/Exercise - 07/21/21 0001       Exercises   Exercises Knee/Hip      Knee/Hip Exercises: Aerobic   Recumbent Bike L3 x 6 min      Knee/Hip Exercises: Standing   SLS x 30 sec bil    Other Standing Knee Exercises foward T's 2 x 10 bil with chair for safety    Other Standing Knee Exercises sun salutations x 3  PT Education - 07/21/21 1103     Education Details HEP update    Person(s) Educated Patient    Methods Explanation;Demonstration;Verbal cues;Handout    Comprehension Verbalized understanding;Returned demonstration              PT Short Term Goals - 07/07/21 1709       PT SHORT TERM GOAL #1   Title Ind with initial HEP    Time 2    Period Weeks    Status Achieved    Target Date 07/08/21               PT Long Term Goals - 07/21/21 1028       PT LONG TERM GOAL #1   Title Ind with progressed HEP for LE strengthening.    Time 4    Period Weeks    Status Achieved   met current     PT LONG TERM GOAL #2   Title Pt. will demonstrated full R knee extension not limited by pain.    Baseline -20 deg quad lag with LAQ, -9 deg R knee extension in long sit    Time 4    Period Weeks    Status Achieved   9/14- met, tightness back of knee with extension     PT LONG TERM GOAL #3   Title Pt. will demonstrate 4+5 RLE strength    Baseline see flowsheet, decreased R quad/hamstring strength due to pain.    Time 4    Period Weeks    Status Achieved   07/21/21- 5/5 without pain R knee  flexion/extension     PT LONG TERM GOAL #4   Title Pt. will report 50% improvement in R knee pain    Time 4    Period Weeks    Status Achieved   07/07/21- 90% improvement already  07/21/21- full recovery                  Plan - 07/21/21 1021     Clinical Impression Statement Tara [name] has made excellent progress. She has returned to all activities without R knee pain, has not had any more incidents of R knee buckling, and has met all goals.  She is ready and appropriate for discharge.  Focused exercises today on HEP update for balance and core strengthening exercises.    Comorbidities chronic knee pain, torn meniscus, knee OA, HTN    PT Frequency 2x / week    PT Duration 4 weeks    PT Treatment/Interventions ADLs/Self Care Home Management;Cryotherapy;Electrical Stimulation;Iontophoresis 58m/ml Dexamethasone;Moist Heat;Ultrasound;Gait training;Stair training;Functional mobility training;Therapeutic activities;Therapeutic exercise;Neuromuscular re-education;Patient/family education;Orthotic Fit/Training;Manual techniques;Passive range of motion;Dry needling;Taping;Vasopneumatic Device;Joint Manipulations    PT Next Visit Plan progress HEP as tolerated, modalities PRN - add RDLs    PT Home Exercise Plan Access Code: [email or web address](07/07/21)    Consulted and Agree with Plan of Care Patient             Patient will benefit from skilled therapeutic intervention in order to improve the following deficits and impairments:  Abnormal gait, Decreased endurance, Decreased mobility, Difficulty walking, Increased muscle spasms, Decreased range of motion, Increased edema, Decreased activity tolerance, Decreased strength, Pain, Impaired flexibility, Increased fascial restricitons  Visit Diagnosis: Chronic pain of right knee  Stiffness of right knee, not elsewhere classified  Cramp and spasm  Localized edema  Muscle weakness (generalized)     Problem List Patient Active Problem List    Diagnosis Date Noted   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  [place], [place], [address] Phone: [phone]   Fax:  [phone]  Name: SHARANYA TEMPLIN MRN: [id number] Date of Birth: 12-12-1975

## 2021-08-02 ENCOUNTER — Ambulatory Visit: Payer: No Typology Code available for payment source | Attending: Internal Medicine

## 2021-08-02 DIAGNOSIS — Z23 Encounter for immunization: Secondary | ICD-10-CM

## 2021-08-02 NOTE — Progress Notes (Signed)
   Covid-19 Vaccination Clinic  Name:  Tara [name]    MRN: [id number] DOB: 1976-03-12  08/02/2021  [name] was observed post Covid-19 immunization for 15 minutes without incident. She was provided with Vaccine Information Sheet and instruction to access the V-Safe system.   Ms. Favorite was instructed to call 911 with any severe reactions post vaccine: Difficulty breathing  Swelling of face and throat  A fast heartbeat  A bad rash all over body  Dizziness and weakness

## 2021-08-04 ENCOUNTER — Other Ambulatory Visit (HOSPITAL_BASED_OUTPATIENT_CLINIC_OR_DEPARTMENT_OTHER): Payer: Self-pay

## 2021-08-04 ENCOUNTER — Other Ambulatory Visit: Payer: Self-pay | Admitting: Sports Medicine

## 2021-08-04 DIAGNOSIS — F419 Anxiety disorder, unspecified: Secondary | ICD-10-CM

## 2021-08-04 MED ORDER — HYDROXYZINE PAMOATE 25 MG PO CAPS
ORAL_CAPSULE | ORAL | 3 refills | Status: DC
  Filled 2021-08-04: qty 180, 30d supply, fill #0
  Filled 2021-11-09: qty 180, 30d supply, fill #1
  Filled 2021-12-02: qty 180, 30d supply, fill #2

## 2021-08-11 ENCOUNTER — Other Ambulatory Visit (HOSPITAL_BASED_OUTPATIENT_CLINIC_OR_DEPARTMENT_OTHER): Payer: Self-pay

## 2021-08-11 MED ORDER — PFIZER COVID-19 VAC BIVALENT 30 MCG/0.3ML IM SUSP
INTRAMUSCULAR | 0 refills | Status: DC
  Filled 2021-08-11: qty 0.3, 1d supply, fill #0

## 2021-08-13 ENCOUNTER — Other Ambulatory Visit (HOSPITAL_BASED_OUTPATIENT_CLINIC_OR_DEPARTMENT_OTHER): Payer: Self-pay

## 2021-08-13 MED ORDER — INFLUENZA VAC SPLIT QUAD 0.5 ML IM SUSY
PREFILLED_SYRINGE | INTRAMUSCULAR | 0 refills | Status: DC
  Filled 2021-08-13: qty 0.5, 1d supply, fill #0

## 2021-08-26 ENCOUNTER — Other Ambulatory Visit: Payer: Self-pay | Admitting: Sports Medicine

## 2021-08-26 ENCOUNTER — Other Ambulatory Visit (HOSPITAL_BASED_OUTPATIENT_CLINIC_OR_DEPARTMENT_OTHER): Payer: Self-pay

## 2021-08-26 DIAGNOSIS — K219 Gastro-esophageal reflux disease without esophagitis: Secondary | ICD-10-CM

## 2021-08-26 MED ORDER — PANTOPRAZOLE SODIUM 40 MG PO TBEC
DELAYED_RELEASE_TABLET | Freq: Two times a day (BID) | ORAL | 3 refills | Status: DC
  Filled 2021-08-26: qty 180, 90d supply, fill #0
  Filled 2021-12-02: qty 180, 90d supply, fill #1

## 2021-08-30 ENCOUNTER — Other Ambulatory Visit (HOSPITAL_COMMUNITY): Payer: Self-pay

## 2021-08-30 ENCOUNTER — Ambulatory Visit (HOSPITAL_BASED_OUTPATIENT_CLINIC_OR_DEPARTMENT_OTHER): Payer: No Typology Code available for payment source

## 2021-08-31 ENCOUNTER — Other Ambulatory Visit (HOSPITAL_COMMUNITY): Payer: Self-pay

## 2021-09-01 ENCOUNTER — Other Ambulatory Visit (HOSPITAL_COMMUNITY): Payer: Self-pay

## 2021-09-08 ENCOUNTER — Other Ambulatory Visit: Payer: Self-pay | Admitting: Sports Medicine

## 2021-09-08 ENCOUNTER — Other Ambulatory Visit (HOSPITAL_BASED_OUTPATIENT_CLINIC_OR_DEPARTMENT_OTHER): Payer: Self-pay

## 2021-09-08 DIAGNOSIS — I10 Essential (primary) hypertension: Secondary | ICD-10-CM

## 2021-09-08 MED ORDER — LOSARTAN POTASSIUM 100 MG PO TABS
ORAL_TABLET | Freq: Every day | ORAL | 3 refills | Status: DC
  Filled 2021-09-08: qty 90, 90d supply, fill #0
  Filled 2021-12-02: qty 90, 90d supply, fill #1

## 2021-09-13 ENCOUNTER — Telehealth: Payer: Self-pay

## 2021-09-13 NOTE — Telephone Encounter (Signed)
Medication: Flibanserin (ADDYI) 100 MG TABS Prior authorization submitted via CoverMyMeds on 09/13/2021 [place] submission pending

## 2021-09-15 NOTE — Telephone Encounter (Signed)
Addyi denied by [employer].  Denial letter placed in provider's basket.

## 2021-09-23 ENCOUNTER — Other Ambulatory Visit (HOSPITAL_BASED_OUTPATIENT_CLINIC_OR_DEPARTMENT_OTHER): Payer: Self-pay

## 2021-09-29 ENCOUNTER — Other Ambulatory Visit: Payer: Self-pay

## 2021-09-29 ENCOUNTER — Ambulatory Visit (INDEPENDENT_AMBULATORY_CARE_PROVIDER_SITE_OTHER): Payer: No Typology Code available for payment source | Admitting: Sports Medicine

## 2021-09-29 DIAGNOSIS — I10 Essential (primary) hypertension: Secondary | ICD-10-CM

## 2021-09-29 DIAGNOSIS — Z0000 Encounter for general adult medical examination without abnormal findings: Secondary | ICD-10-CM

## 2021-09-29 DIAGNOSIS — M2550 Pain in unspecified joint: Secondary | ICD-10-CM | POA: Diagnosis not present

## 2021-09-29 DIAGNOSIS — R635 Abnormal weight gain: Secondary | ICD-10-CM

## 2021-09-29 DIAGNOSIS — Z800 Family history of malignant neoplasm of digestive organs: Secondary | ICD-10-CM | POA: Diagnosis not present

## 2021-09-29 NOTE — Assessment & Plan Note (Signed)
Polyarthralgia, I really think it is the weather and the season, we will do a full rheumatoid work-up.

## 2021-09-29 NOTE — Assessment & Plan Note (Signed)
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

## 2021-09-29 NOTE — Assessment & Plan Note (Signed)
Needs a string check for her IUD as well as a new gynecology provider, referral to Dr. Gala Romney.

## 2021-09-29 NOTE — Assessment & Plan Note (Signed)
Needs an updated referral to Dr. Collene Mares

## 2021-09-29 NOTE — Progress Notes (Signed)
    Procedures performed today:    None.  Independent interpretation of notes and tests performed by another provider:   None.  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   Polyarthralgia Polyarthralgia, I really think it is the weather and the season, we will do a full rheumatoid work-up.  Annual physical exam Needs a string check for Tara [name] IUD as well as a new gynecology provider, referral to Dr. Gala Tara [name].  Family history of colon cancer Needs an updated referral to Dr. Collene Tara [name]    ___________________________________________ Tara Tara [name]. Tara Tara [name], M.D., ABFM., CAQSM. Primary Care and [place] Instructor of [place] [facility]

## 2021-10-05 ENCOUNTER — Encounter: Payer: Self-pay | Admitting: Sports Medicine

## 2021-10-06 ENCOUNTER — Other Ambulatory Visit (HOSPITAL_COMMUNITY): Payer: Self-pay

## 2021-10-07 ENCOUNTER — Telehealth: Payer: Self-pay

## 2021-10-07 DIAGNOSIS — M2550 Pain in unspecified joint: Secondary | ICD-10-CM

## 2021-10-07 NOTE — Telephone Encounter (Signed)
Patient called to report her concerned about the drop in her hgb from 14 to 12 in such a short time. She reports her mother had colon cancer and this is how it started. She asked if we can add an iron panel onto the blood work. (The blood has been in the lab for 3 days)   She is scheduled for a Colonoscopy in Feb 2023. Please advise

## 2021-10-07 NOTE — Telephone Encounter (Signed)
Yes that is fine, she is not microcytic, and the colonoscopy will tell but I will place the orders.  If it is too late to add on the labs then she will just have to come to get them redrawn.

## 2021-10-07 NOTE — Telephone Encounter (Signed)
Labs added through [facility] [employer]  # [id number]. Patient aware that the labs have been added on. Since the blood has been in the lab for 3 days, patient is aware that they may not be able to add these new tests. The lab will let us know. If not then I will let patient know and she can come have them drawn separately. She verbalized understanding.

## 2021-10-08 ENCOUNTER — Other Ambulatory Visit (HOSPITAL_BASED_OUTPATIENT_CLINIC_OR_DEPARTMENT_OTHER): Payer: Self-pay

## 2021-10-08 MED ORDER — CLENPIQ 10-3.5-12 MG-GM -GM/160ML PO SOLN
ORAL | 0 refills | Status: DC
  Filled 2021-10-08: qty 320, 1d supply, fill #0

## 2021-10-09 LAB — CBC WITH DIFFERENTIAL/PLATELET
Absolute Monocytes: 354 cells/uL (ref 200–950)
Basophils Absolute: 31 cells/uL (ref 0–200)
Basophils Relative: 0.6 %
Eosinophils Absolute: 10 cells/uL — ABNORMAL LOW (ref 15–500)
Eosinophils Relative: 0.2 %
HCT: 38 % (ref 35.0–45.0)
Hemoglobin: 12.5 g/dL (ref 11.7–15.5)
Lymphs Abs: 1555 cells/uL (ref 850–3900)
MCH: 28.5 pg (ref 27.0–33.0)
MCHC: 32.9 g/dL (ref 32.0–36.0)
MCV: 86.6 fL (ref 80.0–100.0)
MPV: 10.8 fL (ref 7.5–12.5)
Monocytes Relative: 6.8 %
Neutro Abs: 3250 cells/uL (ref 1500–7800)
Neutrophils Relative %: 62.5 %
Platelets: 255 10*3/uL (ref 140–400)
RBC: 4.39 10*6/uL (ref 3.80–5.10)
RDW: 12.7 % (ref 11.0–15.0)
Total Lymphocyte: 29.9 %
WBC: 5.2 10*3/uL (ref 3.8–10.8)

## 2021-10-09 LAB — HEMOGLOBIN A1C
Hgb A1c MFr Bld: 5.3 % of total Hgb (ref <5.7)
Mean Plasma Glucose: 105 mg/dL
eAG (mmol/L): 5.8 mmol/L

## 2021-10-09 LAB — LUPUS(12) PANEL
Anti Nuclear Antibody (ANA): POSITIVE — AB
C3 Complement: 109 mg/dL (ref 83–193)
C4 Complement: 28 mg/dL (ref 15–57)
ENA SM Ab Ser-aCnc: <1 AI
Rheumatoid fact SerPl-aCnc: <14 IU/mL (ref <14)
Ribosomal P Protein Ab: <1 AI
SM/RNP: <1 AI
SSA (Ro) (ENA) Antibody, IgG: <1 AI
SSB (La) (ENA) Antibody, IgG: <1 AI
Scleroderma (Scl-70) (ENA) Antibody, IgG: <1 AI
Thyroperoxidase Ab SerPl-aCnc: <1 IU/mL (ref <9)
ds DNA Ab: 1 IU/mL

## 2021-10-09 LAB — IRON,TIBC AND FERRITIN PANEL
%SAT: 25 % (calc) (ref 16–45)
Ferritin: 58 ng/mL (ref 16–232)
Iron: 67 ug/dL (ref 40–190)
TIBC: 265 mcg/dL (calc) (ref 250–450)

## 2021-10-09 LAB — ANTI-NUCLEAR AB-TITER (ANA TITER): ANA Titer 1: 1:40 {titer} — ABNORMAL HIGH

## 2021-10-09 LAB — LIPID PANEL
Cholesterol: 202 mg/dL — ABNORMAL HIGH (ref <200)
HDL: 68 mg/dL (ref >=50)
LDL Cholesterol (Calc): 118 mg/dL (calc) — ABNORMAL HIGH
Non-HDL Cholesterol (Calc): 134 mg/dL (calc) — ABNORMAL HIGH (ref <130)
Total CHOL/HDL Ratio: 3 (calc) (ref <5.0)
Triglycerides: 68 mg/dL (ref <150)

## 2021-10-09 LAB — COMPREHENSIVE METABOLIC PANEL
AG Ratio: 1.7 (calc) (ref 1.0–2.5)
ALT: 12 U/L (ref 6–29)
AST: 11 U/L (ref 10–35)
Albumin: 4.1 g/dL (ref 3.6–5.1)
Alkaline phosphatase (APISO): 41 U/L (ref 31–125)
BUN/Creatinine Ratio: 20 (calc) (ref 6–22)
BUN: 21 mg/dL (ref 7–25)
CO2: 22 mmol/L (ref 20–32)
Calcium: 9.2 mg/dL (ref 8.6–10.2)
Chloride: 110 mmol/L (ref 98–110)
Creat: 1.05 mg/dL — ABNORMAL HIGH (ref 0.50–0.99)
Globulin: 2.4 g/dL (calc) (ref 1.9–3.7)
Glucose, Bld: 92 mg/dL (ref 65–99)
Potassium: 4.5 mmol/L (ref 3.5–5.3)
Sodium: 138 mmol/L (ref 135–146)
Total Bilirubin: 0.4 mg/dL (ref 0.2–1.2)
Total Protein: 6.5 g/dL (ref 6.1–8.1)

## 2021-10-09 LAB — CK: Total CK: 83 U/L (ref 29–143)

## 2021-10-09 LAB — CYCLIC CITRUL PEPTIDE ANTIBODY, IGG: Cyclic Citrullin Peptide Ab: <16 UNITS

## 2021-10-09 LAB — RHEUMATOID FACTOR (IGA, IGG, IGM)
Rheumatoid Factor (IgA): <5 U (ref <=6)
Rheumatoid Factor (IgG): <5 U (ref <=6)
Rheumatoid Factor (IgM): 7 U — ABNORMAL HIGH (ref <=6)

## 2021-10-09 LAB — TSH: TSH: 2.97 mIU/L

## 2021-10-09 LAB — URIC ACID: Uric Acid, Serum: 3.5 mg/dL (ref 2.5–7.0)

## 2021-10-09 LAB — B12 AND FOLATE PANEL
Folate: 12.5 ng/mL
Vitamin B-12: 642 pg/mL (ref 200–1100)

## 2021-10-09 LAB — SEDIMENTATION RATE: Sed Rate: 2 mm/h (ref 0–20)

## 2021-10-11 ENCOUNTER — Inpatient Hospital Stay (HOSPITAL_BASED_OUTPATIENT_CLINIC_OR_DEPARTMENT_OTHER): Admission: RE | Admit: 2021-10-11 | Payer: No Typology Code available for payment source | Source: Ambulatory Visit

## 2021-10-11 LAB — HM COLONOSCOPY

## 2021-10-12 ENCOUNTER — Other Ambulatory Visit (HOSPITAL_BASED_OUTPATIENT_CLINIC_OR_DEPARTMENT_OTHER): Payer: Self-pay

## 2021-10-12 ENCOUNTER — Encounter (HOSPITAL_BASED_OUTPATIENT_CLINIC_OR_DEPARTMENT_OTHER): Payer: Self-pay

## 2021-10-12 ENCOUNTER — Ambulatory Visit (HOSPITAL_BASED_OUTPATIENT_CLINIC_OR_DEPARTMENT_OTHER)
Admission: RE | Admit: 2021-10-12 | Discharge: 2021-10-12 | Disposition: A | Discharge status: Home / Self Care | Payer: No Typology Code available for payment source | Source: Ambulatory Visit | Attending: Sports Medicine | Admitting: Sports Medicine

## 2021-10-12 ENCOUNTER — Other Ambulatory Visit: Payer: Self-pay

## 2021-10-12 DIAGNOSIS — Z1231 Encounter for screening mammogram for malignant neoplasm of breast: Secondary | ICD-10-CM | POA: Insufficient documentation

## 2021-10-20 ENCOUNTER — Other Ambulatory Visit (HOSPITAL_COMMUNITY): Payer: Self-pay

## 2021-10-26 ENCOUNTER — Other Ambulatory Visit (HOSPITAL_COMMUNITY): Payer: Self-pay

## 2021-10-26 NOTE — Progress Notes (Signed)
Last pap- 07/30/19- normal Last mam- 10/12/21- normal

## 2021-10-28 ENCOUNTER — Encounter: Payer: Self-pay | Admitting: Obstetrics & Gynecology

## 2021-10-28 ENCOUNTER — Other Ambulatory Visit: Payer: Self-pay

## 2021-10-28 ENCOUNTER — Other Ambulatory Visit (HOSPITAL_COMMUNITY): Payer: Self-pay

## 2021-10-28 ENCOUNTER — Telehealth: Payer: Self-pay | Admitting: Sports Medicine

## 2021-10-28 ENCOUNTER — Ambulatory Visit (INDEPENDENT_AMBULATORY_CARE_PROVIDER_SITE_OTHER): Payer: No Typology Code available for payment source | Admitting: Obstetrics & Gynecology

## 2021-10-28 VITALS — BP 116/85 | HR 83 | Ht 63.0 in | Wt 154.0 lb

## 2021-10-28 DIAGNOSIS — K649 Unspecified hemorrhoids: Secondary | ICD-10-CM | POA: Diagnosis not present

## 2021-10-28 DIAGNOSIS — Z01419 Encounter for gynecological examination (general) (routine) without abnormal findings: Secondary | ICD-10-CM | POA: Diagnosis not present

## 2021-10-28 DIAGNOSIS — F5222 Female sexual arousal disorder: Secondary | ICD-10-CM

## 2021-10-28 MED ORDER — ADDYI 100 MG PO TABS
1.0000 | ORAL_TABLET | Freq: Every day | ORAL | 11 refills | Status: DC
  Filled 2021-10-28: qty 30, 30d supply, fill #0
  Filled 2021-12-16 – 2021-12-17 (×2): qty 30, 30d supply, fill #1
  Filled 2022-01-17: qty 30, 30d supply, fill #2

## 2021-10-28 NOTE — Telephone Encounter (Signed)
Pt dropped off Colonoscopy results. Put them in the Dr. Mcneil Sober box.   Also pt said that the pharmacy is waiting on additional information from Dr. Darene Lamer for her Addyi prescription.

## 2021-10-28 NOTE — Telephone Encounter (Signed)
I do see a note/telephone call from 09/13/2021 with a denial for Addyi.  I sent it again, to [facility], if you do get another prior authorization request lets do it together so that we can get this approved.

## 2021-10-28 NOTE — Progress Notes (Signed)
Subjective:     Tara [name] is a 46 y.o. female here for a routine exam.  Current complaints: none.  Menses at 8, Had early menopause (had work up for menopause by Dr. Kris Mouton and confirmed early menopause).  Pt has IUD in case of ovulation and wold like to continue for now.    Gynecologic History No LMP recorded. (Menstrual status: IUD). Contraception: IUD Kylena Last Pap: 07/2019. Results were: normal Last mammogram: 09/2021. Results were: normal Colonoscopy:  Nml (Fam Hx of colon cancer)  Obstetric History OB History  No obstetric history on file.     The following portions of the patient's history were reviewed and updated as appropriate: allergies, current medications, past family history, past medical history, past social history, past surgical history, and problem list.  Review of Systems Pertinent items noted in HPI and remainder of comprehensive ROS otherwise negative.    Objective:     Vitals:   10/28/21 0811  BP: 116/85  Pulse: 83  Weight: 154 lb (69.9 kg)  Height: 5\' 3"  (1.6 m)   Vitals:  WNL General appearance: alert, cooperative and no distress  HEENT: Normocephalic, without obvious abnormality, atraumatic Eyes: negative Throat: lips, mucosa, and tongue normal; teeth and gums normal  Respiratory: Clear to auscultation bilaterally  CV: Regular rate and rhythm  Breasts:  Normal appearance, no masses or tenderness, no nipple retraction or dimpling  GI: Soft, non-tender; bowel sounds normal; no masses,  no organomegaly  GU: External Genitalia:  Tanner V, no lesion Urethra:  No prolapse   Vagina: Pink, normal rugae, no blood or discharge  Cervix: No CMT, no lesion, IUD strings seen  Uterus:  Normal size and contour, non tender  Adnexa: Normal, no masses, non tender  Musculoskeletal: No edema, redness or tenderness in the calves or thighs  Skin: No lesions or rash  Lymphatic: Axillary adenopathy: none     Psychiatric: Normal mood and behavior         Assessment:    Healthy female exam.    Plan:    1.  Pap due next year.  No history of abnml paps 2.  Continue yearly mammograms 3.  Colonoscopy per GI for history of mother having colon cancer 4.  Sun screen encouraged and visits to derm. 5.  Continue Addi for decreased libido

## 2021-11-03 ENCOUNTER — Encounter: Payer: Self-pay | Admitting: Sports Medicine

## 2021-11-05 ENCOUNTER — Other Ambulatory Visit (HOSPITAL_BASED_OUTPATIENT_CLINIC_OR_DEPARTMENT_OTHER): Payer: Self-pay

## 2021-11-06 ENCOUNTER — Other Ambulatory Visit (HOSPITAL_COMMUNITY): Payer: Self-pay

## 2021-11-09 ENCOUNTER — Telehealth: Payer: Self-pay

## 2021-11-09 ENCOUNTER — Other Ambulatory Visit (HOSPITAL_BASED_OUTPATIENT_CLINIC_OR_DEPARTMENT_OTHER): Payer: Self-pay

## 2021-11-09 NOTE — Telephone Encounter (Signed)
Medication: Flibanserin (ADDYI) 100 MG TABS Prior authorization submitted via CoverMyMeds on 11/09/2021 [place] submission pending

## 2021-11-10 ENCOUNTER — Other Ambulatory Visit (HOSPITAL_COMMUNITY): Payer: Self-pay

## 2021-11-17 ENCOUNTER — Other Ambulatory Visit (HOSPITAL_COMMUNITY): Payer: Self-pay

## 2021-11-18 ENCOUNTER — Other Ambulatory Visit (HOSPITAL_COMMUNITY): Payer: Self-pay

## 2021-11-19 ENCOUNTER — Other Ambulatory Visit (HOSPITAL_COMMUNITY): Payer: Self-pay

## 2021-12-02 ENCOUNTER — Other Ambulatory Visit: Payer: Self-pay | Admitting: Sports Medicine

## 2021-12-02 ENCOUNTER — Other Ambulatory Visit (HOSPITAL_BASED_OUTPATIENT_CLINIC_OR_DEPARTMENT_OTHER): Payer: Self-pay

## 2021-12-02 DIAGNOSIS — R635 Abnormal weight gain: Secondary | ICD-10-CM

## 2021-12-03 ENCOUNTER — Other Ambulatory Visit (HOSPITAL_BASED_OUTPATIENT_CLINIC_OR_DEPARTMENT_OTHER): Payer: Self-pay

## 2021-12-03 MED ORDER — TOPIRAMATE 100 MG PO TABS
100.0000 mg | ORAL_TABLET | Freq: Two times a day (BID) | ORAL | 1 refills | Status: DC
  Filled 2021-12-03 – 2021-12-06 (×2): qty 180, 90d supply, fill #0

## 2021-12-06 ENCOUNTER — Other Ambulatory Visit (HOSPITAL_BASED_OUTPATIENT_CLINIC_OR_DEPARTMENT_OTHER): Payer: Self-pay

## 2021-12-13 ENCOUNTER — Other Ambulatory Visit (HOSPITAL_BASED_OUTPATIENT_CLINIC_OR_DEPARTMENT_OTHER): Payer: Self-pay

## 2021-12-16 ENCOUNTER — Other Ambulatory Visit (HOSPITAL_COMMUNITY): Payer: Self-pay

## 2021-12-17 ENCOUNTER — Other Ambulatory Visit (HOSPITAL_COMMUNITY): Payer: Self-pay

## 2021-12-20 ENCOUNTER — Other Ambulatory Visit (HOSPITAL_BASED_OUTPATIENT_CLINIC_OR_DEPARTMENT_OTHER): Payer: Self-pay

## 2021-12-20 ENCOUNTER — Other Ambulatory Visit (HOSPITAL_COMMUNITY): Payer: Self-pay

## 2022-01-17 ENCOUNTER — Other Ambulatory Visit (HOSPITAL_COMMUNITY): Payer: Self-pay

## 2022-01-18 ENCOUNTER — Other Ambulatory Visit (HOSPITAL_COMMUNITY): Payer: Self-pay

## 2022-02-01 ENCOUNTER — Ambulatory Visit (INDEPENDENT_AMBULATORY_CARE_PROVIDER_SITE_OTHER): Payer: No Typology Code available for payment source | Admitting: Sports Medicine

## 2022-02-01 ENCOUNTER — Encounter: Payer: Self-pay | Admitting: Sports Medicine

## 2022-02-01 DIAGNOSIS — I10 Essential (primary) hypertension: Secondary | ICD-10-CM | POA: Diagnosis not present

## 2022-02-01 DIAGNOSIS — F419 Anxiety disorder, unspecified: Secondary | ICD-10-CM

## 2022-02-01 DIAGNOSIS — K219 Gastro-esophageal reflux disease without esophagitis: Secondary | ICD-10-CM

## 2022-02-01 DIAGNOSIS — R635 Abnormal weight gain: Secondary | ICD-10-CM

## 2022-02-01 DIAGNOSIS — F5222 Female sexual arousal disorder: Secondary | ICD-10-CM | POA: Diagnosis not present

## 2022-02-01 DIAGNOSIS — Z0000 Encounter for general adult medical examination without abnormal findings: Secondary | ICD-10-CM

## 2022-02-01 MED ORDER — FLUTICASONE PROPIONATE 50 MCG/ACT NA SUSP: 2.0000 | Freq: Every day | NASAL | 1 refills | Status: DC

## 2022-02-01 MED ORDER — LOSARTAN POTASSIUM 100 MG PO TABS: ORAL_TABLET | Freq: Every day | ORAL | 3 refills | Status: DC

## 2022-02-01 MED ORDER — HYDROXYZINE PAMOATE 25 MG PO CAPS: ORAL_CAPSULE | ORAL | 3 refills | Status: DC

## 2022-02-01 MED ORDER — TOPIRAMATE 100 MG PO TABS: 100.0000 mg | ORAL_TABLET | Freq: Two times a day (BID) | ORAL | 1 refills | Status: DC

## 2022-02-01 MED ORDER — HYDROCHLOROTHIAZIDE 25 MG PO TABS: ORAL_TABLET | ORAL | 3 refills | Status: DC

## 2022-02-01 MED ORDER — ADDYI 100 MG PO TABS: 1.0000 | ORAL_TABLET | Freq: Every day | ORAL | 11 refills | Status: DC

## 2022-02-01 MED ORDER — PANTOPRAZOLE SODIUM 40 MG PO TBEC: DELAYED_RELEASE_TABLET | Freq: Two times a day (BID) | ORAL | 3 refills | Status: DC

## 2022-02-01 NOTE — Assessment & Plan Note (Signed)
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 ?Unfortunately she may have to switch providers in the new health system. ?

## 2022-02-01 NOTE — Progress Notes (Signed)
? ? ?  Procedures performed today:   ? ?None. ? ?Independent interpretation of notes and tests performed by another provider:  ? ?None. ? ?Brief History, Exam, Impression, and Recommendations:   ? 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 ?Unfortunately she may have to switch providers in the new health system. ? ? ? ?___________________________________________ ?[name]. Dianah Field, M.D., ABFM., CAQSM. ?Primary Care and Sports Medicine ?[place] ? ?Adjunct Instructor of Family Medicine  ?[employer] ?

## 2022-08-03 ENCOUNTER — Encounter: Payer: No Typology Code available for payment source | Admitting: Sports Medicine

## 2022-10-18 LAB — HM MAMMOGRAPHY

## 2022-10-31 ENCOUNTER — Telehealth: Payer: Self-pay | Admitting: *Deleted

## 2022-10-31 NOTE — Telephone Encounter (Signed)
Left patient a message to call and schedule annual with Dr. Gala Romney for February.

## 2022-11-07 ENCOUNTER — Encounter: Payer: Self-pay | Admitting: Sports Medicine

## 2022-11-07 ENCOUNTER — Ambulatory Visit (INDEPENDENT_AMBULATORY_CARE_PROVIDER_SITE_OTHER): Payer: Managed Care, Other (non HMO) | Admitting: Sports Medicine

## 2022-11-07 VITALS — BP 144/84 | HR 84 | Wt 179.0 lb

## 2022-11-07 DIAGNOSIS — R635 Abnormal weight gain: Secondary | ICD-10-CM | POA: Diagnosis not present

## 2022-11-07 DIAGNOSIS — F419 Anxiety disorder, unspecified: Secondary | ICD-10-CM

## 2022-11-07 DIAGNOSIS — Z0000 Encounter for general adult medical examination without abnormal findings: Secondary | ICD-10-CM

## 2022-11-07 DIAGNOSIS — K219 Gastro-esophageal reflux disease without esophagitis: Secondary | ICD-10-CM

## 2022-11-07 DIAGNOSIS — F5222 Female sexual arousal disorder: Secondary | ICD-10-CM | POA: Diagnosis not present

## 2022-11-07 DIAGNOSIS — I10 Essential (primary) hypertension: Secondary | ICD-10-CM

## 2022-11-07 MED ORDER — PANTOPRAZOLE SODIUM 40 MG PO TBEC: DELAYED_RELEASE_TABLET | Freq: Two times a day (BID) | ORAL | 3 refills | Status: DC

## 2022-11-07 MED ORDER — ADDYI 100 MG PO TABS: 1.0000 | ORAL_TABLET | Freq: Every day | ORAL | 11 refills | Status: DC

## 2022-11-07 MED ORDER — LOSARTAN POTASSIUM-HCTZ 100-25 MG PO TABS: 1.0000 | ORAL_TABLET | Freq: Every day | ORAL | 3 refills | Status: DC

## 2022-11-07 MED ORDER — FLUTICASONE PROPIONATE 50 MCG/ACT NA SUSP: 2.0000 | Freq: Every day | NASAL | 3 refills | Status: AC

## 2022-11-07 MED ORDER — PHENTERMINE HCL 37.5 MG PO TABS: 37.5000 mg | ORAL_TABLET | Freq: Every morning | ORAL | 0 refills | Status: DC

## 2022-11-07 MED ORDER — TOPIRAMATE 100 MG PO TABS: 100.0000 mg | ORAL_TABLET | Freq: Two times a day (BID) | ORAL | 3 refills | Status: DC

## 2022-11-07 MED ORDER — HYDROXYZINE PAMOATE 25 MG PO CAPS: ORAL_CAPSULE | ORAL | 3 refills | Status: DC

## 2022-11-07 NOTE — Assessment & Plan Note (Signed)
Annual physical as above, up-to-date on flu, COVID, GI screening, had a colonoscopy last year, she had a mammogram last December, cervical cancer screening is up-to-date. She has a dermatologist doing skin checks. She is currently partially retired working 3 days a week and living [place] of the [place] in [place].

## 2022-11-07 NOTE — Assessment & Plan Note (Signed)
Would like some help losing weight, goal weight around 160. She desires phentermine, she was unable to tolerate Wegovy even at lower doses. Declines compounded semaglutide. Return monthly for weight checks and refills.

## 2022-11-07 NOTE — Assessment & Plan Note (Signed)
Well-controlled, she is on hydrochlorothiazide 25, losartan 100, we will combine this into a combo pill.

## 2022-11-07 NOTE — Progress Notes (Signed)
Subjective:    CC: Annual Physical Exam  HPI:  This patient is here for their annual physical  I reviewed the past medical history, family history, social history, surgical history, and allergies today and no changes were needed.  Please see the problem list section below in epic for further details.  Past Medical History: Past Medical History:  Diagnosis Date   Allergy    GERD (gastroesophageal reflux disease)    Hypertension    Past Surgical History: Past Surgical History:  Procedure Laterality Date   ADENOIDECTOMY     EYE SURGERY     JOINT REPLACEMENT     TUBAL LIGATION     Social History: Social History   Socioeconomic History   Marital status: Married    Spouse name: Not on file   Number of children: Not on file   Years of education: Not on file   Highest education level: Not on file  Occupational History   Not on file  Tobacco Use   Smoking status: Never   Smokeless tobacco: Never  Substance and Sexual Activity   Alcohol use: No   Drug use: No   Sexual activity: Yes    Birth control/protection: Pill, Surgical  Other Topics Concern   Not on file  Social History Narrative   Not on file   Social Determinants of Health   Financial Resource Strain: Not on file  Food Insecurity: Not on file  Transportation Needs: Not on file  Physical Activity: Not on file  Stress: Not on file  Social Connections: Not on file   Family History: Family History  Problem Relation Age of Onset   Colon cancer Mother        Colon CA   Sudden death Neg Hx    Hypertension Neg Hx    Hyperlipidemia Neg Hx    Diabetes Neg Hx    Heart attack Neg Hx    Allergies: Allergies  Allergen Reactions   Theophyllines    Medications: See med rec.  Review of Systems: No headache, visual changes, nausea, vomiting, diarrhea, constipation, dizziness, abdominal pain, skin rash, fevers, chills, night sweats, swollen lymph nodes, weight loss, chest pain, body aches, joint swelling,  muscle aches, shortness of breath, mood changes, visual or auditory hallucinations.  Objective:    General: Well Developed, well nourished, and in no acute distress.  Neuro: Alert and oriented x3, extra-ocular muscles intact, sensation grossly intact. Cranial nerves II through XII are intact, motor, sensory, and coordinative functions are all intact. HEENT: Normocephalic, atraumatic, pupils equal round reactive to light, neck supple, no masses, no lymphadenopathy, thyroid nonpalpable. Oropharynx, nasopharynx, external ear canals are unremarkable. Skin: Warm and dry, no rashes noted.  Cardiac: Regular rate and rhythm, no murmurs rubs or gallops.  Respiratory: Clear to auscultation bilaterally. Not using accessory muscles, speaking in full sentences.  Abdominal: Soft, nontender, nondistended, positive bowel sounds, no masses, no organomegaly.  Musculoskeletal: Shoulder, elbow, wrist, hip, knee, ankle stable, and with full range of motion.  Impression and Recommendations:    The patient was counselled, risk factors were discussed, anticipatory guidance given.  Annual physical exam Annual physical as above, up-to-date on flu, COVID, GI screening, had a colonoscopy last year, she had a mammogram last December, cervical cancer screening is up-to-date. She has a dermatologist doing skin checks. She is currently partially retired working 3 days a week and living some of the year on the beach in [place].  Abnormal weight gain Would like some help losing weight, goal  weight around 160. She desires phentermine, she was unable to tolerate Wegovy even at lower doses. Declines compounded semaglutide. Return monthly for weight checks and refills.   ____________________________________________ [name]. Dianah Field, M.D., ABFM., CAQSM., AME. Primary Care and Sports Medicine [facility]  Adjunct Professor of [place] of [facility] of  Medicine  Risk manager

## 2022-11-07 NOTE — Addendum Note (Signed)
Addended by: Silverio Decamp on: 11/07/2022 09:21 AM   Modules accepted: Orders

## 2022-11-08 ENCOUNTER — Encounter: Payer: Self-pay | Admitting: Sports Medicine

## 2022-11-08 ENCOUNTER — Telehealth: Payer: Self-pay

## 2022-11-08 LAB — CBC
HCT: 41.6 % (ref 35.0–45.0)
Hemoglobin: 13.8 g/dL (ref 11.7–15.5)
MCH: 28.6 pg (ref 27.0–33.0)
MCHC: 33.2 g/dL (ref 32.0–36.0)
MCV: 86.1 fL (ref 80.0–100.0)
MPV: 11.2 fL (ref 7.5–12.5)
Platelets: 235 10*3/uL (ref 140–400)
RBC: 4.83 10*6/uL (ref 3.80–5.10)
RDW: 13.3 % (ref 11.0–15.0)
WBC: 5 10*3/uL (ref 3.8–10.8)

## 2022-11-08 LAB — COMPLETE METABOLIC PANEL WITH GFR
AG Ratio: 1.7 (calc) (ref 1.0–2.5)
ALT: 19 U/L (ref 6–29)
AST: 16 U/L (ref 10–35)
Albumin: 4.3 g/dL (ref 3.6–5.1)
Alkaline phosphatase (APISO): 52 U/L (ref 31–125)
BUN: 22 mg/dL (ref 7–25)
CO2: 21 mmol/L (ref 20–32)
Calcium: 9.4 mg/dL (ref 8.6–10.2)
Chloride: 113 mmol/L — ABNORMAL HIGH (ref 98–110)
Creat: 0.94 mg/dL (ref 0.50–0.99)
Globulin: 2.5 g/dL (calc) (ref 1.9–3.7)
Glucose, Bld: 94 mg/dL (ref 65–99)
Potassium: 4.2 mmol/L (ref 3.5–5.3)
Sodium: 141 mmol/L (ref 135–146)
Total Bilirubin: 0.3 mg/dL (ref 0.2–1.2)
Total Protein: 6.8 g/dL (ref 6.1–8.1)
eGFR: 75 mL/min/{1.73_m2} (ref >=60)

## 2022-11-08 LAB — LIPID PANEL
Cholesterol: 237 mg/dL — ABNORMAL HIGH (ref <200)
HDL: 74 mg/dL (ref >=50)
LDL Cholesterol (Calc): 144 mg/dL (calc) — ABNORMAL HIGH
Non-HDL Cholesterol (Calc): 163 mg/dL (calc) — ABNORMAL HIGH (ref <130)
Total CHOL/HDL Ratio: 3.2 (calc) (ref <5.0)
Triglycerides: 89 mg/dL (ref <150)

## 2022-11-08 LAB — HEMOGLOBIN A1C
Hgb A1c MFr Bld: 5.8 % of total Hgb — ABNORMAL HIGH (ref <5.7)
Mean Plasma Glucose: 120 mg/dL
eAG (mmol/L): 6.6 mmol/L

## 2022-11-08 LAB — TSH: TSH: 2.92 mIU/L

## 2022-11-08 NOTE — Telephone Encounter (Addendum)
Initiated Prior authorization CLE:[id number] '100MG'$  tablets Via: Covermymeds Case/Key:[id number] Status: denied as of 11/09/21 Reason:This request has not been approved. Based on the information sent for review, the requested drug did not meet our guideline rules. To get the request approved, your doctor needs to show that you have met the guideline rules below. If you have questions, please call your doctor. In some cases, the requested drug or alternatives offered may have other guideline rules that need to be met. 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 2) You are a premenopausal female.This request was denied because your doctor told us that you do not meet the requirements listed above. Please work with your doctor to use a different medication or get [place] more information if it will allow [place] to approve this request. A written notification letter will follow with additional details. Notified Pt via: Mychart

## 2022-11-11 ENCOUNTER — Encounter: Payer: Self-pay | Admitting: Sports Medicine

## 2022-11-13 NOTE — Telephone Encounter (Signed)
She actually meets all those criteria, that's how she has been able to take it for so long and it was approved prior to this attempt.  Please resubmit for patient.

## 2022-11-14 ENCOUNTER — Encounter: Payer: Self-pay | Admitting: Sports Medicine

## 2022-12-05 ENCOUNTER — Encounter: Payer: Self-pay | Admitting: Sports Medicine

## 2022-12-05 ENCOUNTER — Ambulatory Visit: Payer: Managed Care, Other (non HMO) | Admitting: Sports Medicine

## 2022-12-05 DIAGNOSIS — R635 Abnormal weight gain: Secondary | ICD-10-CM

## 2022-12-05 MED ORDER — PHENTERMINE HCL 37.5 MG PO TABS: 37.5000 mg | ORAL_TABLET | Freq: Every morning | ORAL | 0 refills | Status: DC

## 2022-12-05 NOTE — Assessment & Plan Note (Signed)
8 pound weight loss after the first month on phentermine, refilling for 2 months, should do a video visit next. Of note she was unable to tolerate Wegovy even at the lower doses.

## 2022-12-05 NOTE — Progress Notes (Signed)
    Procedures performed today:    None.  Independent interpretation of notes and tests performed by another provider:   None.  Brief History, Exam, Impression, and Recommendations:    Abnormal weight gain 8 pound weight loss after the first month on phentermine, refilling for 2 months, should do a video visit next. Of note she was unable to tolerate Wegovy even at the lower doses.    ____________________________________________ [name]. Dianah Field, M.D., ABFM., CAQSM., AME. Primary Care and Sports Medicine [facility]  Adjunct Professor of [place] of [facility] of Medicine  Risk manager

## 2022-12-12 ENCOUNTER — Telehealth: Payer: Self-pay

## 2022-12-12 ENCOUNTER — Encounter: Payer: Self-pay | Admitting: Sports Medicine

## 2022-12-12 NOTE — Telephone Encounter (Addendum)
Initiated Prior authorization ZOX:WRUEA 100MG  tablets Via: Covermymeds Case/Key:[id number]  Status: approved as of 12/12/22 Reason:please see below the plan's criteria changes:Benefits for all services are subject to terms, conditions and eligibility as outlined in the  benefit documentation in effect at the time services are provided. The authorization is effective from 12/13/2022 to 02/07/2023, as long as you are enrolled as  a member of your current health plan. The request was approved as submitted. This request has been approved for a quantity limit of 1 tablet per day.   Should you require additional medication beyond the approved quantities and dates, your  healthcare practitioner must submit an additional prior authorization request.resubmission,The authorization is effective from 12/13/2022 to 02/07/2023. Notified Pt via: Mychart

## 2022-12-12 NOTE — Telephone Encounter (Incomplete Revision)
Initiated Prior authorization [id number] 100MG tablets Via: Covermymeds Case/Key:[id number]  Status: as of 12/12/22 Reason:resubmission Notified Pt via: Mychart

## 2023-01-03 ENCOUNTER — Telehealth: Payer: Managed Care, Other (non HMO) | Admitting: Sports Medicine

## 2023-02-02 LAB — RESULTS CONSOLE HPV: CHL HPV: NEGATIVE

## 2023-02-02 LAB — HM PAP SMEAR: HM Pap smear: NORMAL

## 2023-02-07 ENCOUNTER — Ambulatory Visit: Payer: Managed Care, Other (non HMO) | Admitting: Sports Medicine

## 2023-03-02 ENCOUNTER — Encounter: Payer: Self-pay | Admitting: Sports Medicine

## 2023-03-03 ENCOUNTER — Telehealth: Payer: Self-pay

## 2023-03-03 NOTE — Telephone Encounter (Signed)
Initiated Prior authorization for:(ADDYI) 100 MG TABS (request for a one year prior auth ) Via: Called medimpact Case/Key:n/a Status: Pending as of 03/03/23  Reason:According to med impact  her last authorization was approved short term because it was a redacted denial turnover to an approval due to the resubmission,  however medimpact did note that benefits for all services are subject to changes in terms,and conditions and eligibility as outlined in the pt benefit booklet, in effect at the time services are provided.  additional medication beyond the approved quantities and dates, require a healthcare practitioner to submit an additional prior authorization request for continued  medical necessity.  when a pt receives an approval a hard copy of the benefit outline of subject changes are sent to the pt via mail which medi-impact sent 2/202/24. The pt was notified by mail of the changes of the short term prior auth   Notified Pt via: Given the nature of the subject matter pt will be notified when insurance has reached a decision via mychart.

## 2023-05-12 ENCOUNTER — Other Ambulatory Visit (HOSPITAL_BASED_OUTPATIENT_CLINIC_OR_DEPARTMENT_OTHER): Payer: Self-pay

## 2023-11-13 ENCOUNTER — Ambulatory Visit (INDEPENDENT_AMBULATORY_CARE_PROVIDER_SITE_OTHER): Payer: Managed Care, Other (non HMO) | Admitting: Sports Medicine

## 2023-11-13 VITALS — BP 122/77 | HR 88 | Ht 63.0 in | Wt 171.0 lb

## 2023-11-13 DIAGNOSIS — Z0000 Encounter for general adult medical examination without abnormal findings: Secondary | ICD-10-CM | POA: Diagnosis not present

## 2023-11-13 DIAGNOSIS — I10 Essential (primary) hypertension: Secondary | ICD-10-CM

## 2023-11-13 NOTE — Progress Notes (Signed)
Subjective:    CC: Annual Physical Exam  HPI:  This patient is here for their annual physical  I reviewed the past medical history, family history, social history, surgical history, and allergies today and no changes were needed.  Please see the problem list section below in epic for further details.  Past Medical History: Past Medical History:  Diagnosis Date   Allergy    GERD (gastroesophageal reflux disease)    Hypertension    Past Surgical History: Past Surgical History:  Procedure Laterality Date   ADENOIDECTOMY     EYE SURGERY     JOINT REPLACEMENT     TUBAL LIGATION     Social History: Social History   Socioeconomic History   Marital status: Married    Spouse name: Not on file   Number of children: Not on file   Years of education: Not on file   Highest education level: Associate degree: academic program  Occupational History   Not on file  Tobacco Use   Smoking status: Never   Smokeless tobacco: Never  Substance and Sexual Activity   Alcohol use: No   Drug use: No   Sexual activity: Yes    Birth control/protection: Pill, Surgical  Other Topics Concern   Not on file  Social History Narrative   Not on file   Social Drivers of Health   Financial Resource Strain: Low Risk  (11/12/2023)   Overall Financial Resource Strain (CARDIA)    Difficulty of Paying Living Expenses: Not hard at all  Food Insecurity: No Food Insecurity (11/12/2023)   Hunger Vital Sign    Worried About Running Out of Food in the Last Year: Never true    Ran Out of Food in the Last Year: Never true  Transportation Needs: No Transportation Needs (11/12/2023)   PRAPARE - Administrator, Civil Service (Medical): No    Lack of Transportation (Non-Medical): No  Physical Activity: Insufficiently Active (11/12/2023)   Exercise Vital Sign    Days of Exercise per Week: 3 days    Minutes of Exercise per Session: 30 min  Stress: No Stress Concern Present (11/12/2023)   [employer] of Occupational Health - Occupational Stress Questionnaire    Feeling of Stress : Only a little  Social Connections: Moderately Isolated (11/12/2023)   Social Connection and Isolation Panel [NHANES]    Frequency of Communication with Friends and Family: More than three times a week    Frequency of Social Gatherings with Friends and Family: Once a week    Attends Religious Services: Never    Database administrator or Organizations: No    Attends Engineer, structural: Not on file    Marital Status: Married   Family History: Family History  Problem Relation Age of Onset   Colon cancer Mother        Colon CA   Sudden death Neg Hx    Hypertension Neg Hx    Hyperlipidemia Neg Hx    Diabetes Neg Hx    Heart attack Neg Hx    Allergies: Allergies  Allergen Reactions   Theophyllines    Medications: See med rec.  Review of Systems: No headache, visual changes, nausea, vomiting, diarrhea, constipation, dizziness, abdominal pain, skin rash, fevers, chills, night sweats, swollen lymph nodes, weight loss, chest pain, body aches, joint swelling, muscle aches, shortness of breath, mood changes, visual or auditory hallucinations.  Objective:    General: Well Developed, well nourished, and in no acute distress.  Neuro: Alert and oriented x3, extra-ocular muscles intact, sensation grossly intact. Cranial nerves II through XII are intact, motor, sensory, and coordinative functions are all intact. HEENT: Normocephalic, atraumatic, pupils equal round reactive to light, neck supple, no masses, no lymphadenopathy, thyroid nonpalpable. Oropharynx, nasopharynx, external ear canals are unremarkable. Skin: Warm and dry, no rashes noted.  Cardiac: Regular rate and rhythm, no murmurs rubs or gallops.  Respiratory: Clear to auscultation bilaterally. Not using accessory muscles, speaking in full sentences.  Abdominal: Soft, nontender, nondistended, positive bowel sounds, no masses, no  organomegaly.  Musculoskeletal: Shoulder, elbow, wrist, hip, knee, ankle stable, and with full range of motion.  Impression and Recommendations:    The patient was counselled, risk factors were discussed, anticipatory guidance given.  Annual physical exam Sting annual physical. Up-to-date on all screenings. Flu shot was done in October. Return to see me in a year. 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   ____________________________________________ Ihor Austin. Benjamin Stain, M.D., ABFM., CAQSM., AME. Primary Care and Sports Medicine [facility]  Adjunct Professor of Family Medicine  [place] [employer] [facility] [employer]  Restaurant manager, fast food

## 2023-11-13 NOTE — Assessment & Plan Note (Signed)
Sting annual physical. Up-to-date on all screenings. Flu shot was done in October. Return to see me in a year. 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

## 2023-11-14 ENCOUNTER — Encounter: Payer: Self-pay | Admitting: Sports Medicine

## 2023-11-14 LAB — COMPREHENSIVE METABOLIC PANEL
ALT: 17 [IU]/L (ref 0–32)
AST: 17 [IU]/L (ref 0–40)
Albumin: 4.6 g/dL (ref 3.9–4.9)
Alkaline Phosphatase: 65 [IU]/L (ref 44–121)
BUN/Creatinine Ratio: 19 (ref 9–23)
BUN: 20 mg/dL (ref 6–24)
Bilirubin Total: 0.3 mg/dL (ref 0.0–1.2)
CO2: 18 mmol/L — ABNORMAL LOW (ref 20–29)
Calcium: 9.5 mg/dL (ref 8.7–10.2)
Chloride: 111 mmol/L — ABNORMAL HIGH (ref 96–106)
Creatinine, Ser: 1.03 mg/dL — ABNORMAL HIGH (ref 0.57–1.00)
Globulin, Total: 2.4 g/dL (ref 1.5–4.5)
Glucose: 89 mg/dL (ref 70–99)
Potassium: 4 mmol/L (ref 3.5–5.2)
Sodium: 143 mmol/L (ref 134–144)
Total Protein: 7 g/dL (ref 6.0–8.5)
eGFR: 67 mL/min/{1.73_m2} (ref >59)

## 2023-11-14 LAB — LIPID PANEL
Chol/HDL Ratio: 3.6 {ratio} (ref 0.0–4.4)
Cholesterol, Total: 235 mg/dL — ABNORMAL HIGH (ref 100–199)
HDL: 66 mg/dL (ref >39)
LDL Chol Calc (NIH): 151 mg/dL — ABNORMAL HIGH (ref 0–99)
Triglycerides: 104 mg/dL (ref 0–149)
VLDL Cholesterol Cal: 18 mg/dL (ref 5–40)

## 2023-11-14 LAB — TSH: TSH: 2.68 u[IU]/mL (ref 0.450–4.500)

## 2023-11-14 LAB — CBC
Hematocrit: 42.8 % (ref 34.0–46.6)
Hemoglobin: 14.3 g/dL (ref 11.1–15.9)
MCH: 28.5 pg (ref 26.6–33.0)
MCHC: 33.4 g/dL (ref 31.5–35.7)
MCV: 85 fL (ref 79–97)
Platelets: 227 10*3/uL (ref 150–450)
RBC: 5.01 x10E6/uL (ref 3.77–5.28)
RDW: 13.7 % (ref 11.7–15.4)
WBC: 5.1 10*3/uL (ref 3.4–10.8)

## 2023-11-14 LAB — HEMOGLOBIN A1C
Est. average glucose Bld gHb Est-mCnc: 114 mg/dL
Hgb A1c MFr Bld: 5.6 % (ref 4.8–5.6)

## 2023-12-12 ENCOUNTER — Other Ambulatory Visit: Payer: Self-pay

## 2023-12-12 ENCOUNTER — Telehealth: Payer: Self-pay

## 2023-12-12 DIAGNOSIS — K219 Gastro-esophageal reflux disease without esophagitis: Secondary | ICD-10-CM

## 2023-12-12 DIAGNOSIS — I10 Essential (primary) hypertension: Secondary | ICD-10-CM

## 2023-12-12 DIAGNOSIS — F419 Anxiety disorder, unspecified: Secondary | ICD-10-CM

## 2023-12-12 DIAGNOSIS — E7889 Other lipoprotein metabolism disorders: Secondary | ICD-10-CM

## 2023-12-12 DIAGNOSIS — R635 Abnormal weight gain: Secondary | ICD-10-CM

## 2023-12-12 DIAGNOSIS — F5222 Female sexual arousal disorder: Secondary | ICD-10-CM

## 2023-12-12 MED ORDER — HYDROXYZINE PAMOATE 25 MG PO CAPS: ORAL_CAPSULE | ORAL | 3 refills | Status: DC

## 2023-12-12 MED ORDER — LOSARTAN POTASSIUM-HCTZ 100-25 MG PO TABS: 1.0000 | ORAL_TABLET | Freq: Every day | ORAL | 3 refills | Status: DC

## 2023-12-12 MED ORDER — ROSUVASTATIN CALCIUM 5 MG PO TABS: 5.0000 mg | ORAL_TABLET | Freq: Every day | ORAL | 0 refills | Status: DC

## 2023-12-12 MED ORDER — TOPIRAMATE 100 MG PO TABS: 100.0000 mg | ORAL_TABLET | Freq: Two times a day (BID) | ORAL | 3 refills | Status: DC

## 2023-12-12 MED ORDER — PANTOPRAZOLE SODIUM 40 MG PO TBEC: DELAYED_RELEASE_TABLET | Freq: Two times a day (BID) | ORAL | 3 refills | Status: DC

## 2023-12-12 NOTE — Telephone Encounter (Signed)
Patient called requesting a rx for Crestor. Med not listed in active rx list. New medication. Per patient, she will have her lipids checked at work for med efficacy in 6 - 8 weeks.   Rx pended for provider review/sign off.

## 2023-12-12 NOTE — Telephone Encounter (Unsigned)
Copied from CRM [id number]. Topic: Clinical - Prescription Issue >> Dec 12, 2023  4:59 PM Shelah Lewandowsky wrote: Reason for CRM: Meds were not sent to pharmacy, crestor not on list and was going to pick it up, please call patient  [phone]

## 2023-12-20 ENCOUNTER — Other Ambulatory Visit: Payer: Self-pay

## 2023-12-20 DIAGNOSIS — E7889 Other lipoprotein metabolism disorders: Secondary | ICD-10-CM

## 2023-12-20 DIAGNOSIS — I10 Essential (primary) hypertension: Secondary | ICD-10-CM

## 2023-12-20 MED ORDER — ROSUVASTATIN CALCIUM 5 MG PO TABS: 5.0000 mg | ORAL_TABLET | Freq: Every day | ORAL | 0 refills | Status: DC

## 2024-03-12 ENCOUNTER — Telehealth: Payer: Self-pay

## 2024-03-12 ENCOUNTER — Encounter: Payer: Self-pay | Admitting: Medical-Surgical

## 2024-03-12 ENCOUNTER — Ambulatory Visit: Admitting: Medical-Surgical

## 2024-03-12 VITALS — BP 130/80 | HR 82 | Resp 20 | Ht 63.0 in | Wt 173.1 lb

## 2024-03-12 DIAGNOSIS — B029 Zoster without complications: Secondary | ICD-10-CM

## 2024-03-12 MED ORDER — VALACYCLOVIR HCL 1 G PO TABS: 1000.0000 mg | ORAL_TABLET | Freq: Three times a day (TID) | ORAL | 0 refills | Status: AC

## 2024-03-12 MED ORDER — HYDROCODONE-ACETAMINOPHEN 5-325 MG PO TABS: 1.0000 | ORAL_TABLET | Freq: Three times a day (TID) | ORAL | 0 refills | Status: DC | PRN

## 2024-03-12 MED ORDER — GABAPENTIN 300 MG PO CAPS: 300.0000 mg | ORAL_CAPSULE | Freq: Three times a day (TID) | ORAL | 1 refills | Status: DC

## 2024-03-12 NOTE — Telephone Encounter (Signed)
 Copied from CRM [id number]. Topic: Clinical - Red Word Triage >> Mar 12, 2024  8:41 AM Jayson Michael wrote: [facility] that prompted transfer to Nurse Triage: lower back pain/ rash on lower flank suspected shingles. - Thekke[facility]andam patient ( per his request and workflow we do not have authorization to handle his patients)  Patient is wanting to get an acute same day appointment she can not return to work until she is seen by a provider- during the call the call dropped and when I tried to call the patient back it went straight to voicemail.   Called [facility] and spoke with Meriam Stamp, asking if i needed to send a thekkekandam pt to our nurse triage or to the clinic she stated to send it to the clinic ( which we dont have that nurse pool in [facility]) she stated she would try to contact the patient directly to schedule patient callback [phone]

## 2024-03-12 NOTE — Patient Instructions (Signed)
 Shingles  Shingles, or herpes zoster, is an infection. It gives you a skin rash and blisters. These infected areas may hurt a lot. Shingles only happens if: You've had chickenpox. You've been given a shot called a vaccine to protect you from getting chickenpox. Shingles is rare in this case. What are the causes? Shingles is caused by a germ called the varicella-zoster virus. This is the same germ that causes chickenpox. After you're exposed to the germ, it stays in your body but is dormant. This means it isn't active. Shingles happens if the germ becomes active again. This can happen years after you're first exposed to the germ. What increases the risk? You may be more likely to get shingles if: You're older than 49 years of age. You're under a lot of stress. You have a weak immune system. The immune system is your body's defense system. It may be weak if: You have human immunodeficiency virus (HIV). You have acquired immunodeficiency syndrome (AIDS). You have cancer. You take medicines that weaken your immune system. These include organ transplant medicines. What are the signs or symptoms? The first symptoms of shingles may be itching, tingling, or pain. Your skin may feel like it's burning. A few days or weeks later, you'll get a rash. Here's what you can expect: The rash is likely to be on one side of your body. The rash may be shaped like a belt or a band. Over time, it will turn into blisters filled with fluid. The blisters will break open and change into scabs. The scabs will dry up in about 2-3 weeks. You may also have: A fever. Chills. A headache. Nausea. How is this diagnosed? Shingles is diagnosed with a skin exam. A sample called a culture may be taken from one of your blisters and sent to a lab. This will show if you have shingles. How is this treated? The rash may last for several weeks. There's no cure for shingles, but your health care provider may give you medicines.  These medicines may: Help with pain. Help with itching. Help with irritation and swelling. Help you get better sooner. Help to prevent long-term problems. If the rash is on your face, you may need to see an eye doctor or an ear, nose, and throat (ENT) doctor. Follow these instructions at home: Medicines Take your medicines only as told by your provider. Put an anti-itch cream or numbing cream on the rash or blisters as told by your provider. Relieving itching and discomfort  To help with itching: Put cold, wet cloths called cold compresses on the rash or blisters. Take a cool bath. Try adding baking soda or dry oatmeal to the water. Do not bathe in hot water. Use calamine lotion on the rash or blisters. You can get this type of lotion at the store. Blister and rash care Keep your rash covered with a loose bandage. Wear loose clothes that don't rub on your rash. Take care of your rash as told by your provider. Make sure you: Wash your hands with soap and water for at least 20 seconds before and after you change your bandage. If you can't use soap and water, use hand sanitizer. Keep your rash and blisters clean by washing them with mild soap and cool water. Change your bandage. Check your rash every day for signs of infection. Check for: More redness, swelling, or pain. Fluid or blood. Warmth. Pus or a bad smell. Do not scratch your rash. Do not pick at your  blisters. To help you not scratch: Keep your fingernails clean and cut short. Try to wear gloves or mittens when you sleep. General instructions Rest. Wash your hands often with soap and water for at least 20 seconds. If you can't use soap and water, use hand sanitizer. Washing your hands lowers your chance of getting a skin infection. Your infection can cause chickenpox in others. If you have blisters that aren't scabs yet, stay away from: Babies. Pregnant people. Children who have eczema. Older people who have organ  transplants. People who have a long-term, or chronic, illness. Anyone who hasn't had chickenpox before. Anyone who hasn't gotten the chickenpox vaccine. How is this prevented? Vaccines are the best way to prevent you from getting chickenpox or shingles. Talk with your provider about getting these shots. Where to find more information Centers for Disease Control and Prevention (CDC): TonerPromos.no Contact a health care provider if: Your pain doesn't get better with medicine. Your pain doesn't get better after the rash heals. You have any signs of infection around the rash. Your rash or blisters get worse. You have a fever or chills. Get help right away if: The rash is on your face or nose. You have pain in your face or by your eye. You lose feeling on one side of your face. You have trouble seeing. You have ear pain or ringing in your ear. This information is not intended to replace advice given to you by your health care provider. Make sure you discuss any questions you have with your health care provider. Document Revised: 07/13/2023 Document Reviewed: 11/25/2022 Elsevier Patient Education  2024 [employer].

## 2024-03-12 NOTE — Progress Notes (Signed)
        Established patient visit  History, exam, impression, and plan:  1. Herpes zoster without complication (Primary) Pleasant 48 year old female presenting today with reports of severe thoracic back pain on the right side that started on Saturday. 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 Tried Robaxin, Tylenol , Voltaren gel, and Motrin  with no relief. Yesterday, found that she has developed a vesicular rash extending from the right thoracic around the flank and onto the abdomen/chest. The rash is unilateral, erythematous, scattered with intact vesicles along one dermatome. Symptoms consistent with Shingles. Treating with Valtrex 1g TID x 7 days. Adding Gabapentin 300mg  TID prn. She is clearly in severe pain during the appointment. Discussed the limited role of narcotics in the setting of shingles but adding Norco for additional pain management.   Procedures performed this visit: None.  Return if symptoms worsen or fail to improve.  __________________________________ Maryl Snook, DNP, APRN, FNP-BC Primary Care and Sports Medicine [facility]

## 2024-03-12 NOTE — Telephone Encounter (Signed)
 Patient scheduled for OV today with Joy Jessup,NP

## 2024-05-13 ENCOUNTER — Other Ambulatory Visit: Payer: Self-pay | Admitting: Medical-Surgical

## 2024-06-25 ENCOUNTER — Encounter: Payer: Self-pay | Admitting: Sports Medicine

## 2024-09-09 ENCOUNTER — Other Ambulatory Visit: Payer: Self-pay | Admitting: Medical-Surgical

## 2024-09-09 DIAGNOSIS — F419 Anxiety disorder, unspecified: Secondary | ICD-10-CM

## 2024-09-09 NOTE — Telephone Encounter (Unsigned)
 Copied from CRM #[id number]. Topic: Clinical - Medication Refill >> Sep 09, 2024 11:45 AM Cynthia K wrote: Medication: hydrOXYzine  (VISTARIL ) 25 MG capsule  Has the patient contacted their pharmacy? Yes (Agent: If no, request that the patient contact the pharmacy for the refill. If patient does not wish to contact the pharmacy document the reason why and proceed with request.) (Agent: If yes, when and what did the pharmacy advise?) Pharmacy needs order to refill  This is the patient's preferred pharmacy:  [facility] [phone] - [place], [place] - [address] AT [facility] [address] [place] [address] Phone: [phone] Fax: [phone]  Is this the correct pharmacy for this prescription? Yes If no, delete pharmacy and type the correct one.   Has the prescription been filled recently? No  Is the patient out of the medication? Yes  Has the patient been seen for an appointment in the last year OR does the patient have an upcoming appointment? Yes  Can we respond through MyChart? No  Agent: Please be advised that Rx refills may take up to 3 business days. We ask that you follow-up with your pharmacy.

## 2024-09-11 MED ORDER — HYDROXYZINE PAMOATE 25 MG PO CAPS: ORAL_CAPSULE | ORAL | 0 refills | Status: DC

## 2024-11-13 ENCOUNTER — Encounter: Payer: Managed Care, Other (non HMO) | Admitting: Sports Medicine

## 2024-11-21 ENCOUNTER — Ambulatory Visit: Admitting: Medical-Surgical

## 2024-11-21 ENCOUNTER — Encounter: Payer: Self-pay | Admitting: Medical-Surgical

## 2024-11-21 VITALS — BP 117/80 | HR 79 | Temp 97.7°F | Resp 20 | Ht 63.0 in | Wt 172.0 lb

## 2024-11-21 DIAGNOSIS — Z0000 Encounter for general adult medical examination without abnormal findings: Secondary | ICD-10-CM | POA: Diagnosis not present

## 2024-11-21 DIAGNOSIS — R635 Abnormal weight gain: Secondary | ICD-10-CM

## 2024-11-21 DIAGNOSIS — W461XXA Contact with contaminated hypodermic needle, initial encounter: Secondary | ICD-10-CM | POA: Diagnosis not present

## 2024-11-21 DIAGNOSIS — K219 Gastro-esophageal reflux disease without esophagitis: Secondary | ICD-10-CM | POA: Diagnosis not present

## 2024-11-21 DIAGNOSIS — Z7689 Persons encountering health services in other specified circumstances: Secondary | ICD-10-CM | POA: Diagnosis not present

## 2024-11-21 DIAGNOSIS — R7303 Prediabetes: Secondary | ICD-10-CM | POA: Diagnosis not present

## 2024-11-21 DIAGNOSIS — F419 Anxiety disorder, unspecified: Secondary | ICD-10-CM | POA: Diagnosis not present

## 2024-11-21 DIAGNOSIS — I10 Essential (primary) hypertension: Secondary | ICD-10-CM

## 2024-11-21 DIAGNOSIS — Z7721 Contact with and (suspected) exposure to potentially hazardous body fluids: Secondary | ICD-10-CM | POA: Diagnosis not present

## 2024-11-21 MED ORDER — TOPIRAMATE 100 MG PO TABS: 100.0000 mg | ORAL_TABLET | Freq: Two times a day (BID) | ORAL | 3 refills | Status: AC

## 2024-11-21 MED ORDER — LOSARTAN POTASSIUM-HCTZ 100-25 MG PO TABS: 1.0000 | ORAL_TABLET | Freq: Every day | ORAL | 3 refills | Status: AC

## 2024-11-21 MED ORDER — PANTOPRAZOLE SODIUM 40 MG PO TBEC: DELAYED_RELEASE_TABLET | Freq: Two times a day (BID) | ORAL | 3 refills | Status: AC

## 2024-11-21 MED ORDER — HYDROXYZINE PAMOATE 25 MG PO CAPS: 50.0000 mg | ORAL_CAPSULE | Freq: Three times a day (TID) | ORAL | 5 refills | Status: AC | PRN

## 2024-11-21 NOTE — Patient Instructions (Signed)
 Preventive Care 50-50 Years Old, Female  Preventive care refers to lifestyle choices and visits with your health care provider that can promote health and wellness. Preventive care visits are also called wellness exams.  What can I expect for my preventive care visit?  Counseling  Your health care provider may ask you questions about your:  Medical history, including:  Past medical problems.  Family medical history.  Pregnancy history.  Current health, including:  Menstrual cycle.  Method of birth control.  Emotional well-being.  Home life and relationship well-being.  Sexual activity and sexual health.  Lifestyle, including:  Alcohol, nicotine or tobacco, and drug use.  Access to firearms.  Diet, exercise, and sleep habits.  Work and work Astronomer.  Sunscreen use.  Safety issues such as seatbelt and bike helmet use.  Physical exam  Your health care provider will check your:  Height and weight. These may be used to calculate your BMI (body mass index). BMI is a measurement that tells if you are at a healthy weight.  Waist circumference. This measures the distance around your waistline. This measurement also tells if you are at a healthy weight and may help predict your risk of certain diseases, such as type 2 diabetes and high blood pressure.  Heart rate and blood pressure.  Body temperature.  Skin for abnormal spots.  What immunizations do I need?    Vaccines are usually given at various ages, according to a schedule. Your health care provider will recommend vaccines for you based on your age, medical history, and lifestyle or other factors, such as travel or where you work.  What tests do I need?  Screening  Your health care provider may recommend screening tests for certain conditions. This may include:  Lipid and cholesterol levels.  Diabetes screening. This is done by checking your blood sugar (glucose) after you have not eaten for a while (fasting).  Pelvic exam and Pap test.  Hepatitis B test.  Hepatitis C  test.  HIV (human immunodeficiency virus) test.  STI (sexually transmitted infection) testing, if you are at risk.  Lung cancer screening.  Colorectal cancer screening.  Mammogram. Talk with your health care provider about when you should start having regular mammograms. This may depend on whether you have a family history of breast cancer.  BRCA-related cancer screening. This may be done if you have a family history of breast, ovarian, tubal, or peritoneal cancers.  Bone density scan. This is done to screen for osteoporosis.  Talk with your health care provider about your test results, treatment options, and if necessary, the need for more tests.  Follow these instructions at home:  Eating and drinking    Eat a diet that includes fresh fruits and vegetables, whole grains, lean protein, and low-fat dairy products.  Take vitamin and mineral supplements as recommended by your health care provider.  Do not drink alcohol if:  Your health care provider tells you not to drink.  You are pregnant, may be pregnant, or are planning to become pregnant.  If you drink alcohol:  Limit how much you have to 0-1 drink a day.  Know how much alcohol is in your drink. In the [place] one drink equals one 12 oz bottle of beer (355 mL), one 5 oz glass of wine (148 mL), or one 1 oz glass of hard liquor (44 mL).  Lifestyle  Brush your teeth every morning and night with fluoride toothpaste. Floss one time each day.  Exercise for at least  30 minutes 5 or more days each week.  Do not use any products that contain nicotine or tobacco. These products include cigarettes, chewing tobacco, and vaping devices, such as e-cigarettes. If you need help quitting, ask your health care provider.  Do not use drugs.  If you are sexually active, practice safe sex. Use a condom or other form of protection to prevent STIs.  If you do not wish to become pregnant, use a form of birth control. If you plan to become pregnant, see your health care provider for a  prepregnancy visit.  Take aspirin only as told by your health care provider. Make sure that you understand how much to take and what form to take. Work with your health care provider to find out whether it is safe and beneficial for you to take aspirin daily.  Find healthy ways to manage stress, such as:  Meditation, yoga, or listening to music.  Journaling.  Talking to a trusted person.  Spending time with friends and family.  Minimize exposure to UV radiation to reduce your risk of skin cancer.  Safety  Always wear your seat belt while driving or riding in a vehicle.  Do not drive:  If you have been drinking alcohol. Do not ride with someone who has been drinking.  When you are tired or distracted.  While texting.  If you have been using any mind-altering substances or drugs.  Wear a helmet and other protective equipment during sports activities.  If you have firearms in your house, make sure you follow all gun safety procedures.  Seek help if you have been physically or sexually abused.  What's next?  Visit your health care provider once a year for an annual wellness visit.  Ask your health care provider how often you should have your eyes and teeth checked.  Stay up to date on all vaccines.  This information is not intended to replace advice given to you by your health care provider. Make sure you discuss any questions you have with your health care provider.  Document Revised: 04/07/2021 Document Reviewed: 04/07/2021  Elsevier Patient Education  2024 [employer].

## 2024-11-21 NOTE — Progress Notes (Signed)
 "  Complete physical exam  Patient: Tara [name]   DOB: 1976-07-16   49 y.o. Female  MRN: [id number]  Subjective:    Chief Complaint  Patient presents with   Transitions Of Care   Annual Exam   Tara [name] is a 49 y.o. female who presents today for a complete physical exam. She reports consuming a general diet. Uses the treadmill three times weekly for 30 minutes each. She generally feels well. She reports sleeping well. She does not have additional problems to discuss today.    Most recent fall risk assessment:    03/12/2024   10:48 AM  Fall Risk   Falls in the past year? 0  Number falls in past yr: 0  Injury with Fall? 0   Risk for fall due to : No Fall Risks  Follow up Falls evaluation completed     Data saved with a previous flowsheet row definition     Most recent depression screenings:    11/21/2024    3:20 PM 11/13/2023    9:47 AM  PHQ 2/9 Scores  PHQ - 2 Score 0 0    Vision:Within last year and Dental: No current dental problems and Receives regular dental care    No care team member to display   Show/hide medication list[1]  Review of Systems  Constitutional:  Positive for malaise/fatigue. Negative for chills, fever and weight loss.  HENT:  Negative for congestion, ear pain, hearing loss, sinus pain and sore throat.   Eyes:  Negative for blurred vision, photophobia and pain.  Respiratory:  Negative for cough, shortness of breath and wheezing.   Cardiovascular:  Negative for chest pain, palpitations and leg swelling.  Gastrointestinal:  Negative for abdominal pain, constipation, diarrhea, heartburn, nausea and vomiting.  Genitourinary:  Negative for dysuria, frequency and urgency.  Musculoskeletal:  Negative for falls and neck pain.  Skin:  Negative for itching and rash.  Neurological:  Negative for dizziness, weakness and headaches.  Endo/Heme/Allergies:  Negative for polydipsia. Does not bruise/bleed easily.  Psychiatric/Behavioral:  Negative for  depression, substance abuse and suicidal ideas. The patient is not nervous/anxious and does not have insomnia.      Objective:     BP 117/80 (BP Location: Right Arm, Cuff Size: Normal)   Pulse 79   Temp 97.7 F (36.5 C) (Oral)   Resp 20   Ht 5' 3 (1.6 m)   Wt 172 lb (78 kg)   SpO2 99%   BMI 30.47 kg/m    Physical Exam Vitals reviewed.  Constitutional:      General: She is not in acute distress.    Appearance: Normal appearance. She is not ill-appearing.  HENT:     Head: Normocephalic and atraumatic.     Right Ear: Tympanic membrane, ear canal and external ear normal. There is no impacted cerumen.     Left Ear: Tympanic membrane, ear canal and external ear normal. There is no impacted cerumen.     Nose: Nose normal. No congestion or rhinorrhea.     Mouth/Throat:     Mouth: Mucous membranes are moist.     Pharynx: No oropharyngeal exudate or posterior oropharyngeal erythema.  Eyes:     General: No scleral icterus.       Right eye: No discharge.        Left eye: No discharge.     Extraocular Movements: Extraocular movements intact.     Conjunctiva/sclera: Conjunctivae normal.     Pupils: Pupils are  equal, round, and reactive to light.  Neck:     Thyroid : No thyromegaly.     Vascular: No carotid bruit or JVD.     Trachea: Trachea normal.  Cardiovascular:     Rate and Rhythm: Normal rate and regular rhythm.     Pulses: Normal pulses.     Heart sounds: Normal heart sounds. No murmur heard.    No friction rub. No gallop.  Pulmonary:     Effort: Pulmonary effort is normal. No respiratory distress.     Breath sounds: Normal breath sounds. No wheezing.  Abdominal:     General: Bowel sounds are normal. There is no distension.     Palpations: Abdomen is soft.     Tenderness: There is no abdominal tenderness. There is no guarding.  Musculoskeletal:        General: Normal range of motion.     Cervical back: Normal range of motion and neck supple.  Lymphadenopathy:      Cervical: No cervical adenopathy.  Skin:    General: Skin is warm and dry.  Neurological:     Mental Status: She is alert and oriented to person, place, and time.     Cranial Nerves: No cranial nerve deficit.  Psychiatric:        Mood and Affect: Mood normal.        Behavior: Behavior normal.        Thought Content: Thought content normal.        Judgment: Judgment normal.      No results found for any visits on 11/21/24.     Assessment & Plan:    Routine Health Maintenance and Physical Exam  Immunization History  Administered Date(s) Administered   Influenza Whole 07/07/2020   Influenza,inj,Quad PF,6+ Mos 07/08/2018, 07/29/2019, 08/13/2021, 08/10/2022   Influenza,inj,Quad PF,6-35 Mos 07/13/2020   Influenza-Unspecified 06/30/2016, 07/11/2017, 07/25/2019, 08/13/2021, 08/10/2022, 08/08/2023, 08/23/2024   Moderna Covid-19 Vaccine Bivalent Booster 43yrs & up 08/22/2022   Moderna SARS-COV2 Booster Vaccination 09/11/2020   PFIZER(Purple Top)SARS-COV-2 Vaccination 10/16/2019, 11/06/2019   Pfizer Covid-19 Vaccine Bivalent Booster 49yrs & up 08/02/2021   Tdap 04/08/2008, 08/02/2018    Health Maintenance  Topic Date Due   HIV Screening  Never done   Hepatitis C Screening  Never done   Hepatitis B Vaccines 19-59 Average Risk (1 of 3 - 19+ 3-dose series) Never done   Mammogram  10/19/2023   COVID-19 Vaccine (6 - 2025-26 season) 12/07/2024 (Originally 06/24/2024)   Cervical Cancer Screening (HPV/Pap Cotest)  02/02/2028   DTaP/Tdap/Td (3 - Td or Tdap) 08/02/2028   Colonoscopy  10/12/2031   Influenza Vaccine  Completed   HPV VACCINES (No Doses Required) Completed   Pneumococcal Vaccine  Aged Out   Meningococcal B Vaccine  Aged Out    Discussed health benefits of physical activity, and encouraged her to engage in regular exercise appropriate for her age and condition.  1. Encounter to establish care Reviewed available information and discussed care concerns with patient.   2.  Annual physical exam (Primary) Updating labs today. UTD on preventative care. Wellness information provided with AVS.   3. Benign essential hypertension BP well controlled today. Updating labs. Continue losartan -hydrochlorothiazide  100-25mg  daily.  - Lipid panel - CBC with Differential/Platelet - CMP14+EGFR - losartan -hydrochlorothiazide  (HYZAAR) 100-25 MG tablet; Take 1 tablet by mouth daily.  Dispense: 90 tablet; Refill: 3  4. Anxiety Well managed with hydroxyzine  as needed. Refilling today.  - hydrOXYzine  (VISTARIL ) 25 MG capsule; Take 2 capsules (50 mg total)  by mouth every 8 (eight) hours as needed.  Dispense: 180 capsule; Refill: 5  5. Gastroesophageal reflux disease, unspecified whether esophagitis present Well managed with Protonix  40mg  BID. Refilling today.  - pantoprazole  (PROTONIX ) 40 MG tablet; TAKE 1 TABLET (40 MG TOTAL) BY MOUTH 2 (TWO) TIMES DAILY BEFORE A MEAL.  Dispense: 180 tablet; Refill: 3  6. Abnormal weight gain Continues Topamax  100mg  BID to help with weight but also for the benefit with headaches. Refilling today.  - topiramate  (TOPAMAX ) 100 MG tablet; Take 1 tablet (100 mg total) by mouth 2 (two) times daily.  Dispense: 180 tablet; Refill: 3  7. 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 No treatment was recommended by Employee heatlh. Now having fatigue and is concerned. Rechecking STI profile.  - STI Profile  8. Prediabetes Rechecking A1c.  - Hemoglobin A1c   Return in about 1 year (around 11/21/2025) for annual physical exam or sooner if needed.   Zada Palin, NP      [1]  Outpatient Medications Prior to Visit  Medication Sig   estradiol (ESTRACE) 0.1 MG/GM vaginal cream Place 1 Applicatorful vaginally 2 (two) times a week.   fluticasone  (FLONASE ) 50 MCG/ACT nasal spray Place 2  sprays into both nostrils daily.   loratadine (CLARITIN) 10 MG tablet Take 10 mg by mouth daily.   [DISCONTINUED] gabapentin  (NEURONTIN ) 300 MG capsule Take 1 capsule (300 mg total) by mouth 3 (three) times daily.   [DISCONTINUED] HYDROcodone -acetaminophen  (NORCO/VICODIN) 5-325 MG tablet Take 1 tablet by mouth every 8 (eight) hours as needed for moderate pain (pain score 4-6).   [DISCONTINUED] hydrOXYzine  (VISTARIL ) 25 MG capsule TAKE 1-2 CAPSULES (25-50 MG TOTAL) BY MOUTH EVERY 8 (EIGHT) HOURS AS NEEDED.   [DISCONTINUED] influenza vac split quadrivalent PF (FLUARIX) 0.5 ML injection Inject into the muscle.   [DISCONTINUED] losartan -hydrochlorothiazide  (HYZAAR) 100-25 MG tablet Take 1 tablet by mouth daily.   [DISCONTINUED] pantoprazole  (PROTONIX ) 40 MG tablet TAKE 1 TABLET (40 MG TOTAL) BY MOUTH 2 (TWO) TIMES DAILY BEFORE A MEAL.   [DISCONTINUED] rosuvastatin  (CRESTOR ) 5 MG tablet Take 1 tablet (5 mg total) by mouth daily.   [DISCONTINUED] topiramate  (TOPAMAX ) 100 MG tablet Take 1 tablet (100 mg total) by mouth 2 (two) times daily.   No facility-administered medications prior to visit.   "

## 2024-11-22 ENCOUNTER — Ambulatory Visit: Payer: Self-pay | Admitting: Medical-Surgical

## 2024-11-22 LAB — CMP14+EGFR
ALT: 20 [IU]/L (ref 0–32)
AST: 17 [IU]/L (ref 0–40)
Albumin: 4.6 g/dL (ref 3.9–4.9)
Alkaline Phosphatase: 72 [IU]/L (ref 41–116)
BUN/Creatinine Ratio: 15 (ref 9–23)
BUN: 16 mg/dL (ref 6–24)
Bilirubin Total: 0.4 mg/dL (ref 0.0–1.2)
CO2: 20 mmol/L (ref 20–29)
Calcium: 9.6 mg/dL (ref 8.7–10.2)
Chloride: 107 mmol/L — ABNORMAL HIGH (ref 96–106)
Creatinine, Ser: 1.08 mg/dL — ABNORMAL HIGH (ref 0.57–1.00)
Globulin, Total: 2.5 g/dL (ref 1.5–4.5)
Glucose: 81 mg/dL (ref 70–99)
Potassium: 4.3 mmol/L (ref 3.5–5.2)
Sodium: 142 mmol/L (ref 134–144)
Total Protein: 7.1 g/dL (ref 6.0–8.5)
eGFR: 63 mL/min/{1.73_m2}

## 2024-11-22 LAB — LIPID PANEL
Chol/HDL Ratio: 3.4 ratio (ref 0.0–4.4)
Cholesterol, Total: 230 mg/dL — ABNORMAL HIGH (ref 100–199)
HDL: 67 mg/dL
LDL Chol Calc (NIH): 150 mg/dL — ABNORMAL HIGH (ref 0–99)
Triglycerides: 74 mg/dL (ref 0–149)
VLDL Cholesterol Cal: 13 mg/dL (ref 5–40)

## 2024-11-22 LAB — CBC WITH DIFFERENTIAL/PLATELET
Basophils Absolute: 0.1 10*3/uL (ref 0.0–0.2)
Basos: 1 %
EOS (ABSOLUTE): 0.2 10*3/uL (ref 0.0–0.4)
Eos: 3 %
Hematocrit: 42 % (ref 34.0–46.6)
Hemoglobin: 14.2 g/dL (ref 11.1–15.9)
Immature Grans (Abs): 0 10*3/uL (ref 0.0–0.1)
Immature Granulocytes: 0 %
Lymphocytes Absolute: 1.4 10*3/uL (ref 0.7–3.1)
Lymphs: 22 %
MCH: 29.5 pg (ref 26.6–33.0)
MCHC: 33.8 g/dL (ref 31.5–35.7)
MCV: 87 fL (ref 79–97)
Monocytes Absolute: 0.4 10*3/uL (ref 0.1–0.9)
Monocytes: 6 %
Neutrophils Absolute: 4.3 10*3/uL (ref 1.4–7.0)
Neutrophils: 68 %
Platelets: 258 10*3/uL (ref 150–450)
RBC: 4.82 x10E6/uL (ref 3.77–5.28)
RDW: 13.3 % (ref 11.7–15.4)
WBC: 6.5 10*3/uL (ref 3.4–10.8)

## 2024-11-22 LAB — HCV INTERPRETATION

## 2024-11-22 LAB — STI PROFILE
HCV Ab: NONREACTIVE
HIV Screen 4th Generation wRfx: NONREACTIVE
Hep B Core Total Ab: NEGATIVE
Hep B Surface Ab, Qual: NONREACTIVE
Hepatitis B Surface Ag: NEGATIVE
RPR Ser Ql: NONREACTIVE

## 2024-11-22 LAB — HEMOGLOBIN A1C
Est. average glucose Bld gHb Est-mCnc: 108 mg/dL
Hgb A1c MFr Bld: 5.4 % (ref 4.8–5.6)

## 2025-11-24 ENCOUNTER — Encounter: Payer: Self-pay | Admitting: Urgent Care
